# Patient Record
Sex: Female | Born: 1950 | Race: White | Hispanic: No | Smoking: Never smoker
Health system: Southern US, Community
[De-identification: ages and names within clinical notes are randomized; demographics above are authoritative.]

## PROBLEM LIST (undated history)

## (undated) DIAGNOSIS — Z87442 Personal history of urinary calculi: Secondary | ICD-10-CM

## (undated) DIAGNOSIS — I2510 Atherosclerotic heart disease of native coronary artery without angina pectoris: Secondary | ICD-10-CM

## (undated) DIAGNOSIS — G4730 Sleep apnea, unspecified: Secondary | ICD-10-CM

## (undated) HISTORY — PX: TUBAL LIGATION: SHX77

## (undated) HISTORY — PX: TONSILLECTOMY AND ADENOIDECTOMY: SHX28

## (undated) HISTORY — PX: EYE SURGERY: SHX253

---

## 2002-01-26 HISTORY — PX: JOINT REPLACEMENT: SHX530

## 2002-01-29 ENCOUNTER — Encounter: Payer: Self-pay | Admitting: Orthopedic Surgery

## 2002-02-04 ENCOUNTER — Encounter: Payer: Self-pay | Admitting: Orthopedic Surgery

## 2002-02-04 ENCOUNTER — Inpatient Hospital Stay (HOSPITAL_COMMUNITY): Admission: RE | Admit: 2002-02-04 | Discharge: 2002-02-08 | Payer: Self-pay | Admitting: Orthopedic Surgery

## 2002-02-25 ENCOUNTER — Encounter: Admission: RE | Admit: 2002-02-25 | Discharge: 2002-05-10 | Payer: Self-pay | Admitting: Orthopedic Surgery

## 2002-04-25 ENCOUNTER — Ambulatory Visit (HOSPITAL_BASED_OUTPATIENT_CLINIC_OR_DEPARTMENT_OTHER): Admission: RE | Admit: 2002-04-25 | Discharge: 2002-04-25 | Payer: Self-pay | Admitting: Orthopedic Surgery

## 2004-02-27 ENCOUNTER — Ambulatory Visit (HOSPITAL_COMMUNITY): Admission: RE | Admit: 2004-02-27 | Discharge: 2004-02-27 | Payer: Self-pay | Admitting: Internal Medicine

## 2004-02-27 ENCOUNTER — Ambulatory Visit: Payer: Self-pay | Admitting: Internal Medicine

## 2012-02-08 DIAGNOSIS — R079 Chest pain, unspecified: Secondary | ICD-10-CM

## 2012-02-09 ENCOUNTER — Encounter: Payer: Self-pay | Admitting: Cardiology

## 2012-02-09 DIAGNOSIS — R079 Chest pain, unspecified: Secondary | ICD-10-CM

## 2016-06-10 ENCOUNTER — Encounter: Payer: Self-pay | Admitting: Physician Assistant

## 2016-06-10 ENCOUNTER — Ambulatory Visit (INDEPENDENT_AMBULATORY_CARE_PROVIDER_SITE_OTHER): Payer: BLUE CROSS/BLUE SHIELD | Admitting: Physician Assistant

## 2016-06-10 VITALS — BP 146/81 | HR 58 | Temp 96.8°F | Ht 61.0 in | Wt 191.0 lb

## 2016-06-10 DIAGNOSIS — F509 Eating disorder, unspecified: Secondary | ICD-10-CM

## 2016-06-10 DIAGNOSIS — Z0000 Encounter for general adult medical examination without abnormal findings: Secondary | ICD-10-CM | POA: Diagnosis not present

## 2016-06-10 DIAGNOSIS — R030 Elevated blood-pressure reading, without diagnosis of hypertension: Secondary | ICD-10-CM | POA: Insufficient documentation

## 2016-06-10 MED ORDER — BUPROPION HCL ER (SR) 150 MG PO TB12: 150.0000 mg | ORAL_TABLET | Freq: Two times a day (BID) | ORAL | 5 refills | Status: DC

## 2016-06-10 NOTE — Patient Instructions (Signed)
DASH Eating Plan DASH stands for "Dietary Approaches to Stop Hypertension." The DASH eating plan is a healthy eating plan that has been shown to reduce high blood pressure (hypertension). It may also reduce your risk for type 2 diabetes, heart disease, and stroke. The DASH eating plan may also help with weight loss. What are tips for following this plan? General guidelines  Avoid eating more than 2,300 mg (milligrams) of salt (sodium) a day. If you have hypertension, you may need to reduce your sodium intake to 1,500 mg a day.  Limit alcohol intake to no more than 1 drink a day for nonpregnant women and 2 drinks a day for men. One drink equals 12 oz of beer, 5 oz of wine, or 1 oz of hard liquor.  Work with your health care provider to maintain a healthy body weight or to lose weight. Ask what an ideal weight is for you.  Get at least 30 minutes of exercise that causes your heart to beat faster (aerobic exercise) most days of the week. Activities may include walking, swimming, or biking.  Work with your health care provider or diet and nutrition specialist (dietitian) to adjust your eating plan to your individual calorie needs. Reading food labels  Check food labels for the amount of sodium per serving. Choose foods with less than 5 percent of the Daily Value of sodium. Generally, foods with less than 300 mg of sodium per serving fit into this eating plan.  To find whole grains, look for the word "whole" as the first word in the ingredient list. Shopping  Buy products labeled as "low-sodium" or "no salt added."  Buy fresh foods. Avoid canned foods and premade or frozen meals. Cooking  Avoid adding salt when cooking. Use salt-free seasonings or herbs instead of table salt or sea salt. Check with your health care provider or pharmacist before using salt substitutes.  Do not fry foods. Cook foods using healthy methods such as baking, boiling, grilling, and broiling instead.  Cook with  heart-healthy oils, such as olive, canola, soybean, or sunflower oil. Meal planning   Eat a balanced diet that includes: ? 5 or more servings of fruits and vegetables each day. At each meal, try to fill half of your plate with fruits and vegetables. ? Up to 6-8 servings of whole grains each day. ? Less than 6 oz of lean meat, poultry, or fish each day. A 3-oz serving of meat is about the same size as a deck of cards. One egg equals 1 oz. ? 2 servings of low-fat dairy each day. ? A serving of nuts, seeds, or beans 5 times each week. ? Heart-healthy fats. Healthy fats called Omega-3 fatty acids are found in foods such as flaxseeds and coldwater fish, like sardines, salmon, and mackerel.  Limit how much you eat of the following: ? Canned or prepackaged foods. ? Food that is high in trans fat, such as fried foods. ? Food that is high in saturated fat, such as fatty meat. ? Sweets, desserts, sugary drinks, and other foods with added sugar. ? Full-fat dairy products.  Do not salt foods before eating.  Try to eat at least 2 vegetarian meals each week.  Eat more home-cooked food and less restaurant, buffet, and fast food.  When eating at a restaurant, ask that your food be prepared with less salt or no salt, if possible. What foods are recommended? The items listed may not be a complete list. Talk with your dietitian about what   dietary choices are best for you. Grains Whole-grain or whole-wheat bread. Whole-grain or whole-wheat pasta. Brown rice. Oatmeal. Quinoa. Bulgur. Whole-grain and low-sodium cereals. Pita bread. Low-fat, low-sodium crackers. Whole-wheat flour tortillas. Vegetables Fresh or frozen vegetables (raw, steamed, roasted, or grilled). Low-sodium or reduced-sodium tomato and vegetable juice. Low-sodium or reduced-sodium tomato sauce and tomato paste. Low-sodium or reduced-sodium canned vegetables. Fruits All fresh, dried, or frozen fruit. Canned fruit in natural juice (without  added sugar). Meat and other protein foods Skinless chicken or turkey. Ground chicken or turkey. Pork with fat trimmed off. Fish and seafood. Egg whites. Dried beans, peas, or lentils. Unsalted nuts, nut butters, and seeds. Unsalted canned beans. Lean cuts of beef with fat trimmed off. Low-sodium, lean deli meat. Dairy Low-fat (1%) or fat-free (skim) milk. Fat-free, low-fat, or reduced-fat cheeses. Nonfat, low-sodium ricotta or cottage cheese. Low-fat or nonfat yogurt. Low-fat, low-sodium cheese. Fats and oils Soft margarine without trans fats. Vegetable oil. Low-fat, reduced-fat, or light mayonnaise and salad dressings (reduced-sodium). Canola, safflower, olive, soybean, and sunflower oils. Avocado. Seasoning and other foods Herbs. Spices. Seasoning mixes without salt. Unsalted popcorn and pretzels. Fat-free sweets. What foods are not recommended? The items listed may not be a complete list. Talk with your dietitian about what dietary choices are best for you. Grains Baked goods made with fat, such as croissants, muffins, or some breads. Dry pasta or rice meal packs. Vegetables Creamed or fried vegetables. Vegetables in a cheese sauce. Regular canned vegetables (not low-sodium or reduced-sodium). Regular canned tomato sauce and paste (not low-sodium or reduced-sodium). Regular tomato and vegetable juice (not low-sodium or reduced-sodium). Pickles. Olives. Fruits Canned fruit in a light or heavy syrup. Fried fruit. Fruit in cream or butter sauce. Meat and other protein foods Fatty cuts of meat. Ribs. Fried meat. Bacon. Sausage. Bologna and other processed lunch meats. Salami. Fatback. Hotdogs. Bratwurst. Salted nuts and seeds. Canned beans with added salt. Canned or smoked fish. Whole eggs or egg yolks. Chicken or turkey with skin. Dairy Whole or 2% milk, cream, and half-and-half. Whole or full-fat cream cheese. Whole-fat or sweetened yogurt. Full-fat cheese. Nondairy creamers. Whipped toppings.  Processed cheese and cheese spreads. Fats and oils Butter. Stick margarine. Lard. Shortening. Ghee. Bacon fat. Tropical oils, such as coconut, palm kernel, or palm oil. Seasoning and other foods Salted popcorn and pretzels. Onion salt, garlic salt, seasoned salt, table salt, and sea salt. Worcestershire sauce. Tartar sauce. Barbecue sauce. Teriyaki sauce. Soy sauce, including reduced-sodium. Steak sauce. Canned and packaged gravies. Fish sauce. Oyster sauce. Cocktail sauce. Horseradish that you find on the shelf. Ketchup. Mustard. Meat flavorings and tenderizers. Bouillon cubes. Hot sauce and Tabasco sauce. Premade or packaged marinades. Premade or packaged taco seasonings. Relishes. Regular salad dressings. Where to find more information:  National Heart, Lung, and Blood Institute: www.nhlbi.nih.gov  American Heart Association: www.heart.org Summary  The DASH eating plan is a healthy eating plan that has been shown to reduce high blood pressure (hypertension). It may also reduce your risk for type 2 diabetes, heart disease, and stroke.  With the DASH eating plan, you should limit salt (sodium) intake to 2,300 mg a day. If you have hypertension, you may need to reduce your sodium intake to 1,500 mg a day.  When on the DASH eating plan, aim to eat more fresh fruits and vegetables, whole grains, lean proteins, low-fat dairy, and heart-healthy fats.  Work with your health care provider or diet and nutrition specialist (dietitian) to adjust your eating plan to your individual   calorie needs. This information is not intended to replace advice given to you by your health care provider. Make sure you discuss any questions you have with your health care provider. Document Released: 03/03/2011 Document Revised: 03/07/2016 Document Reviewed: 03/07/2016 Elsevier Interactive Patient Education  2017 Elsevier Inc.  

## 2016-06-10 NOTE — Progress Notes (Signed)
BP (!) 146/81   Pulse (!) 58   Temp (!) 96.8 F (36 C) (Oral)   Ht '5\' 1"'$  (1.549 m)   Wt 191 lb (86.6 kg)   BMI 36.09 kg/m    Subjective:    Patient ID: Alyssa Burns, female    DOB: 1950-06-24, 66 y.o.   MRN: [id number]  Alyssa Burns is a 66 y.o. female presenting on 06/10/2016 for Establish Care  HPI This patient comes in for annual well physical examination. All medications are reviewed today. There are no reports of any problems with the medications. All of the medical conditions are reviewed and updated.  Lab work is reviewed and will be ordered as medically necessary. There are no new problems reported with today's visit.  Patient reports doing well overall.  She does report increased appetite and overeating later in the day. She will have emotional and stressful eating. We have discussed how Wellbutrin is used for this type of emotional eating and she is willing to try the medication.  History reviewed. No pertinent past medical history. Relevant past medical, surgical, family and social history reviewed and updated as indicated. Interim medical history since our last visit reviewed. Allergies and medications reviewed and updated.   Data reviewed from any sources in EPIC.  Review of Systems  Constitutional: Negative.  Negative for activity change, fatigue and fever.  HENT: Negative.   Eyes: Negative.   Respiratory: Negative.  Negative for cough.   Cardiovascular: Negative.  Negative for chest pain.  Gastrointestinal: Negative.  Negative for abdominal pain.  Endocrine: Negative.   Genitourinary: Negative.  Negative for dysuria.  Musculoskeletal: Negative.   Skin: Negative.   Neurological: Negative.   Psychiatric/Behavioral: Positive for dysphoric mood.     Social History   Social History  . Marital status: Married    Spouse name: N/A  . Number of children: N/A  . Years of education: N/A   Occupational History  . Not on file.   Social History Main  Topics  . Smoking status: Never Smoker  . Smokeless tobacco: Never Used  . Alcohol use No  . Drug use: No  . Sexual activity: Not on file   Other Topics Concern  . Not on file   Social History Narrative  . No narrative on file    Past Surgical History:  Procedure Laterality Date  . JOINT REPLACEMENT Left 01/2002   knee  . TONSILLECTOMY AND ADENOIDECTOMY    . TUBAL LIGATION      Family History  Problem Relation Age of Onset  . Arthritis Mother   . Hearing loss Mother   . Arthritis Father   . Cancer Father     prostate  . Diabetes Father   . Heart disease Father   . Heart disease Brother     Allergies as of 06/10/2016      Reactions   Percocet [oxycodone-acetaminophen] Itching      Medication List       Accurate as of 06/10/16  4:48 PM. Always use your most recent med list.          buPROPion 150 MG 12 hr tablet Commonly known as:  WELLBUTRIN SR Take 1 tablet (150 mg total) by mouth 2 (two) times daily.          Objective:    BP (!) 146/81   Pulse (!) 58   Temp (!) 96.8 F (36 C) (Oral)   Ht '5\' 1"'$  (1.549 m)   Wt  191 lb (86.6 kg)   BMI 36.09 kg/m   Allergies  Allergen Reactions  . Percocet [Oxycodone-Acetaminophen] Itching   Wt Readings from Last 3 Encounters:  06/10/16 191 lb (86.6 kg)    Physical Exam  Constitutional: She is oriented to person, place, and time. She appears well-developed and well-nourished.  HENT:  Head: Normocephalic and atraumatic.  Right Ear: Tympanic membrane, external ear and ear canal normal.  Left Ear: Tympanic membrane, external ear and ear canal normal.  Nose: Nose normal. No rhinorrhea.  Mouth/Throat: Oropharynx is clear and moist and mucous membranes are normal. No oropharyngeal exudate or posterior oropharyngeal erythema.  Eyes: Conjunctivae and EOM are normal. Pupils are equal, round, and reactive to light.  Neck: Normal range of motion. Neck supple.  Cardiovascular: Normal rate, regular rhythm, normal heart  sounds and intact distal pulses.   Pulmonary/Chest: Effort normal and breath sounds normal.  Abdominal: Soft. Bowel sounds are normal.  Neurological: She is alert and oriented to person, place, and time. She has normal reflexes.  Skin: Skin is warm and dry. No rash noted.  Psychiatric: She has a normal mood and affect. Her behavior is normal. Judgment and thought content normal.  Nursing note and vitals reviewed.   No results found for this or any previous visit.    Assessment & Plan:   1. Well adult exam - CBC with Differential/Platelet - CMP14+EGFR - Lipid panel - TSH  2. Elevated BP without diagnosis of hypertension Monitor at her office and call id now improved  3. Disorder of eating - buPROPion (WELLBUTRIN SR) 150 MG 12 hr tablet; Take 1 tablet (150 mg total) by mouth 2 (two) times daily.  Dispense: 30 tablet; Refill: 5   Continue all other maintenance medications as listed above. Educational handout given for DASH diet  Follow up plan: Return in about 3 months (around 09/10/2016) for recheck.  Terald Sleeper PA-C [facility] [address]  [place],  [address] [phone]   06/10/2016, 4:48 PM

## 2016-06-11 LAB — CMP14+EGFR
ALT: 14 IU/L (ref 0–32)
AST: 16 IU/L (ref 0–40)
Albumin/Globulin Ratio: 1.7 (ref 1.2–2.2)
Albumin: 4.3 g/dL (ref 3.6–4.8)
Alkaline Phosphatase: 77 IU/L (ref 39–117)
BUN/Creatinine Ratio: 12 (ref 12–28)
BUN: 12 mg/dL (ref 8–27)
Bilirubin Total: 0.3 mg/dL (ref 0.0–1.2)
CO2: 27 mmol/L (ref 18–29)
Calcium: 10.1 mg/dL (ref 8.7–10.3)
Chloride: 101 mmol/L (ref 96–106)
Creatinine, Ser: 0.97 mg/dL (ref 0.57–1.00)
GFR calc Af Amer: 71 mL/min/{1.73_m2} (ref >59)
GFR calc non Af Amer: 61 mL/min/{1.73_m2} (ref >59)
Globulin, Total: 2.5 g/dL (ref 1.5–4.5)
Glucose: 96 mg/dL (ref 65–99)
Potassium: 4.2 mmol/L (ref 3.5–5.2)
Sodium: 144 mmol/L (ref 134–144)
Total Protein: 6.8 g/dL (ref 6.0–8.5)

## 2016-06-11 LAB — CBC WITH DIFFERENTIAL/PLATELET
Basophils Absolute: 0 10*3/uL (ref 0.0–0.2)
Basos: 0 %
EOS (ABSOLUTE): 0.1 10*3/uL (ref 0.0–0.4)
Eos: 2 %
Hematocrit: 42.5 % (ref 34.0–46.6)
Hemoglobin: 14.4 g/dL (ref 11.1–15.9)
Immature Grans (Abs): 0 10*3/uL (ref 0.0–0.1)
Immature Granulocytes: 0 %
Lymphocytes Absolute: 2 10*3/uL (ref 0.7–3.1)
Lymphs: 29 %
MCH: 28.1 pg (ref 26.6–33.0)
MCHC: 33.9 g/dL (ref 31.5–35.7)
MCV: 83 fL (ref 79–97)
Monocytes Absolute: 0.4 10*3/uL (ref 0.1–0.9)
Monocytes: 5 %
Neutrophils Absolute: 4.3 10*3/uL (ref 1.4–7.0)
Neutrophils: 64 %
Platelets: 215 10*3/uL (ref 150–379)
RBC: 5.13 x10E6/uL (ref 3.77–5.28)
RDW: 14.5 % (ref 12.3–15.4)
WBC: 6.7 10*3/uL (ref 3.4–10.8)

## 2016-06-11 LAB — TSH: TSH: 2.38 u[IU]/mL (ref 0.450–4.500)

## 2016-06-11 LAB — LIPID PANEL
Chol/HDL Ratio: 4.1 ratio units (ref 0.0–4.4)
Cholesterol, Total: 197 mg/dL (ref 100–199)
HDL: 48 mg/dL (ref >39)
LDL Calculated: 109 mg/dL — ABNORMAL HIGH (ref 0–99)
Triglycerides: 200 mg/dL — ABNORMAL HIGH (ref 0–149)
VLDL Cholesterol Cal: 40 mg/dL (ref 5–40)

## 2016-08-12 ENCOUNTER — Other Ambulatory Visit: Payer: Self-pay | Admitting: Physician Assistant

## 2016-08-12 DIAGNOSIS — Z780 Asymptomatic menopausal state: Secondary | ICD-10-CM

## 2016-08-19 ENCOUNTER — Ambulatory Visit (INDEPENDENT_AMBULATORY_CARE_PROVIDER_SITE_OTHER): Payer: BLUE CROSS/BLUE SHIELD

## 2016-08-19 DIAGNOSIS — Z780 Asymptomatic menopausal state: Secondary | ICD-10-CM

## 2018-05-11 ENCOUNTER — Encounter: Payer: Self-pay | Admitting: Physician Assistant

## 2018-05-11 ENCOUNTER — Ambulatory Visit (INDEPENDENT_AMBULATORY_CARE_PROVIDER_SITE_OTHER): Payer: Medicare Other | Admitting: Physician Assistant

## 2018-05-11 VITALS — BP 143/84 | HR 64 | Temp 96.9°F | Ht 61.0 in | Wt 176.6 lb

## 2018-05-11 DIAGNOSIS — Z0000 Encounter for general adult medical examination without abnormal findings: Secondary | ICD-10-CM

## 2018-05-11 NOTE — Patient Instructions (Signed)
Health Maintenance After Age 67 After age 67, you are at a higher risk for certain long-term diseases and infections as well as injuries from falls. Falls are a major cause of broken bones and head injuries in people who are older than age 67. Getting regular preventive care can help to keep you healthy and well. Preventive care includes getting regular testing and making lifestyle changes as recommended by your health care provider. Talk with your health care provider about:  Which screenings and tests you should have. A screening is a test that checks for a disease when you have no symptoms.  A diet and exercise plan that is right for you. What should I know about screenings and tests to prevent falls? Screening and testing are the best ways to find a health problem early. Early diagnosis and treatment give you the best chance of managing medical conditions that are common after age 67. Certain conditions and lifestyle choices may make you more likely to have a fall. Your health care provider may recommend:  Regular vision checks. Poor vision and conditions such as cataracts can make you more likely to have a fall. If you wear glasses, make sure to get your prescription updated if your vision changes.  Medicine review. Work with your health care provider to regularly review all of the medicines you are taking, including over-the-counter medicines. Ask your health care provider about any side effects that may make you more likely to have a fall. Tell your health care provider if any medicines that you take make you feel dizzy or sleepy.  Osteoporosis screening. Osteoporosis is a condition that causes the bones to get weaker. This can make the bones weak and cause them to break more easily.  Blood pressure screening. Blood pressure changes and medicines to control blood pressure can make you feel dizzy.  Strength and balance checks. Your health care provider may recommend certain tests to check your  strength and balance while standing, walking, or changing positions.  Foot health exam. Foot pain and numbness, as well as not wearing proper footwear, can make you more likely to have a fall.  Depression screening. You may be more likely to have a fall if you have a fear of falling, feel emotionally low, or feel unable to do activities that you used to do.  Alcohol use screening. Using too much alcohol can affect your balance and may make you more likely to have a fall. What actions can I take to lower my risk of falls? General instructions  Talk with your health care provider about your risks for falling. Tell your health care provider if: ? You fall. Be sure to tell your health care provider about all falls, even ones that seem minor. ? You feel dizzy, sleepy, or off-balance.  Take over-the-counter and prescription medicines only as told by your health care provider. These include any supplements.  Eat a healthy diet and maintain a healthy weight. A healthy diet includes low-fat dairy products, low-fat (lean) meats, and fiber from whole grains, beans, and lots of fruits and vegetables. Home safety  Remove any tripping hazards, such as rugs, cords, and clutter.  Install safety equipment such as grab bars in bathrooms and safety rails on stairs.  Keep rooms and walkways well-lit. Activity   Follow a regular exercise program to stay fit. This will help you maintain your balance. Ask your health care provider what types of exercise are appropriate for you.  If you need a cane or   walker, use it as recommended by your health care provider.  Wear supportive shoes that have nonskid soles. Lifestyle  Do not drink alcohol if your health care provider tells you not to drink.  If you drink alcohol, limit how much you have: ? 0-1 drink a day for women. ? 0-2 drinks a day for men.  Be aware of how much alcohol is in your drink. In the [place] one drink equals one typical bottle of beer (12  oz), one-half glass of wine (5 oz), or one shot of hard liquor (1 oz).  Do not use any products that contain nicotine or tobacco, such as cigarettes and e-cigarettes. If you need help quitting, ask your health care provider. Summary  Having a healthy lifestyle and getting preventive care can help to protect your health and wellness after age 67.  Screening and testing are the best way to find a health problem early and help you avoid having a fall. Early diagnosis and treatment give you the best chance for managing medical conditions that are more common for people who are older than age 67.  Falls are a major cause of broken bones and head injuries in people who are older than age 67. Take precautions to prevent a fall at home.  Work with your health care provider to learn what changes you can make to improve your health and wellness and to prevent falls. This information is not intended to replace advice given to you by your health care provider. Make sure you discuss any questions you have with your health care provider. Document Released: 01/25/2017 Document Revised: 01/25/2017 Document Reviewed: 01/25/2017 Elsevier Interactive Patient Education  2019 Elsevier Inc.  

## 2018-05-11 NOTE — Progress Notes (Signed)
BP (!) 143/84   Pulse 64   Temp (!) 96.9 F (36.1 C) (Oral)   Ht '5\' 1"'$  (1.549 m)   Wt 176 lb 9.6 oz (80.1 kg)   BMI 33.37 kg/m    Subjective:    Patient ID: Alyssa Burns, female    DOB: 1950-08-24, 68 y.o.   MRN: [id number]  HPI: Alyssa Burns is a 68 y.o. female presenting on 05/11/2018 for Annual Exam  This patient comes in for annual well physical examination. All medications are reviewed today. There are no reports of any problems with the medications. All of the medical conditions are reviewed and updated.  Lab work is reviewed and will be ordered as medically necessary. There are no new problems reported with today's visit.  Patient reports doing well overall.  She has lost about 20 pounds doing keto and daily exercise.  She is feeling quite good.  History reviewed. No pertinent past medical history. Relevant past medical, surgical, family and social history reviewed and updated as indicated. Interim medical history since our last visit reviewed. Allergies and medications reviewed and updated. DATA REVIEWED: CHART IN EPIC  Family History reviewed for pertinent findings.  Review of Systems  Constitutional: Negative.  Negative for activity change, fatigue and fever.  HENT: Negative.   Eyes: Negative.   Respiratory: Negative.  Negative for cough.   Cardiovascular: Negative.  Negative for chest pain.  Gastrointestinal: Negative.  Negative for abdominal pain.  Endocrine: Negative.   Genitourinary: Negative.  Negative for dysuria.  Musculoskeletal: Positive for arthralgias.  Skin: Negative.   Neurological: Negative.     Allergies as of 05/11/2018      Reactions   Percocet [oxycodone-acetaminophen] Itching      Medication List    as of May 11, 2018 12:48 PM   You have not been prescribed any medications.        Objective:    BP (!) 143/84   Pulse 64   Temp (!) 96.9 F (36.1 C) (Oral)   Ht '5\' 1"'$  (1.549 m)   Wt 176 lb 9.6 oz (80.1 kg)   BMI  33.37 kg/m   Allergies  Allergen Reactions  . Percocet [Oxycodone-Acetaminophen] Itching    Wt Readings from Last 3 Encounters:  05/11/18 176 lb 9.6 oz (80.1 kg)  06/10/16 191 lb (86.6 kg)    Physical Exam Constitutional:      Appearance: She is well-developed.  HENT:     Head: Normocephalic and atraumatic.     Right Ear: Tympanic membrane, ear canal and external ear normal.     Left Ear: Tympanic membrane, ear canal and external ear normal.     Nose: Nose normal. No rhinorrhea.     Mouth/Throat:     Pharynx: No oropharyngeal exudate or posterior oropharyngeal erythema.  Eyes:     Conjunctiva/sclera: Conjunctivae normal.     Pupils: Pupils are equal, round, and reactive to light.  Neck:     Musculoskeletal: Normal range of motion and neck supple.  Cardiovascular:     Rate and Rhythm: Normal rate and regular rhythm.     Heart sounds: Normal heart sounds.  Pulmonary:     Effort: Pulmonary effort is normal.     Breath sounds: Normal breath sounds.  Abdominal:     General: Bowel sounds are normal.     Palpations: Abdomen is soft.  Skin:    General: Skin is warm and dry.     Findings: No rash.  Neurological:  Mental Status: She is alert and oriented to person, place, and time.     Deep Tendon Reflexes: Reflexes are normal and symmetric.  Psychiatric:        Behavior: Behavior normal.        Thought Content: Thought content normal.        Judgment: Judgment normal.         Assessment & Plan:   1. Well adult exam - NMR, lipoprofile - CBC with Differential/Platelet - CMP14+EGFR - TSH   Continue all other maintenance medications as listed above.  Follow up plan: Return in about 1 year (around 05/12/2019).  Educational handout given for health maintenance  Terald Sleeper PA-C [facility] [address]  [place], [place] [address] [phone]   05/11/2018, 12:48 PM

## 2018-05-12 LAB — CMP14+EGFR
A/G RATIO: 1.9 (ref 1.2–2.2)
ALT: 20 IU/L (ref 0–32)
AST: 17 IU/L (ref 0–40)
Albumin: 4.2 g/dL (ref 3.8–4.8)
Alkaline Phosphatase: 68 IU/L (ref 39–117)
BUN/Creatinine Ratio: 20 (ref 12–28)
BUN: 15 mg/dL (ref 8–27)
Bilirubin Total: 0.5 mg/dL (ref 0.0–1.2)
CALCIUM: 9.4 mg/dL (ref 8.7–10.3)
CO2: 20 mmol/L (ref 20–29)
CREATININE: 0.74 mg/dL (ref 0.57–1.00)
Chloride: 103 mmol/L (ref 96–106)
GFR, EST AFRICAN AMERICAN: 97 mL/min/{1.73_m2} (ref >59)
GFR, EST NON AFRICAN AMERICAN: 84 mL/min/{1.73_m2} (ref >59)
GLOBULIN, TOTAL: 2.2 g/dL (ref 1.5–4.5)
Glucose: 92 mg/dL (ref 65–99)
Potassium: 3.9 mmol/L (ref 3.5–5.2)
SODIUM: 142 mmol/L (ref 134–144)
TOTAL PROTEIN: 6.4 g/dL (ref 6.0–8.5)

## 2018-05-12 LAB — NMR, LIPOPROFILE
Cholesterol, Total: 208 mg/dL — ABNORMAL HIGH (ref 100–199)
HDL Particle Number: 32.6 umol/L (ref >=30.5)
HDL-C: 49 mg/dL (ref >39)
LDL Particle Number: 1377 nmol/L — ABNORMAL HIGH (ref <1000)
LDL Size: 20.9 nm (ref >20.5)
LDL-C: 137 mg/dL — AB (ref 0–99)
LP-IR Score: 45 (ref <=45)
Small LDL Particle Number: 292 nmol/L (ref <=527)
TRIGLYCERIDES: 111 mg/dL (ref 0–149)

## 2018-05-12 LAB — CBC WITH DIFFERENTIAL/PLATELET
BASOS: 0 %
Basophils Absolute: 0 10*3/uL (ref 0.0–0.2)
EOS (ABSOLUTE): 0.1 10*3/uL (ref 0.0–0.4)
EOS: 1 %
HEMATOCRIT: 41.5 % (ref 34.0–46.6)
HEMOGLOBIN: 13.9 g/dL (ref 11.1–15.9)
IMMATURE GRANS (ABS): 0 10*3/uL (ref 0.0–0.1)
IMMATURE GRANULOCYTES: 0 %
LYMPHS: 29 %
Lymphocytes Absolute: 1.6 10*3/uL (ref 0.7–3.1)
MCH: 28 pg (ref 26.6–33.0)
MCHC: 33.5 g/dL (ref 31.5–35.7)
MCV: 84 fL (ref 79–97)
MONOCYTES: 6 %
Monocytes Absolute: 0.3 10*3/uL (ref 0.1–0.9)
NEUTROS PCT: 64 %
Neutrophils Absolute: 3.4 10*3/uL (ref 1.4–7.0)
Platelets: 201 10*3/uL (ref 150–450)
RBC: 4.97 x10E6/uL (ref 3.77–5.28)
RDW: 14.3 % (ref 11.7–15.4)
WBC: 5.3 10*3/uL (ref 3.4–10.8)

## 2018-05-12 LAB — TSH: TSH: 2.45 u[IU]/mL (ref 0.450–4.500)

## 2018-08-02 ENCOUNTER — Other Ambulatory Visit: Payer: Self-pay

## 2018-08-02 ENCOUNTER — Encounter (HOSPITAL_BASED_OUTPATIENT_CLINIC_OR_DEPARTMENT_OTHER): Payer: Self-pay | Admitting: *Deleted

## 2018-08-03 ENCOUNTER — Encounter (HOSPITAL_BASED_OUTPATIENT_CLINIC_OR_DEPARTMENT_OTHER)
Admission: RE | Disposition: A | Discharge status: Home / Self Care | Payer: Self-pay | Source: Home / Self Care | Attending: Orthopaedic Surgery

## 2018-08-03 ENCOUNTER — Encounter (HOSPITAL_BASED_OUTPATIENT_CLINIC_OR_DEPARTMENT_OTHER): Payer: Self-pay | Admitting: *Deleted

## 2018-08-03 ENCOUNTER — Ambulatory Visit (HOSPITAL_BASED_OUTPATIENT_CLINIC_OR_DEPARTMENT_OTHER): Payer: Medicare Other | Admitting: Anesthesiology

## 2018-08-03 ENCOUNTER — Ambulatory Visit (HOSPITAL_BASED_OUTPATIENT_CLINIC_OR_DEPARTMENT_OTHER)
Admission: RE | Admit: 2018-08-03 | Discharge: 2018-08-03 | Disposition: A | Discharge status: Home / Self Care | Payer: Medicare Other | Attending: Orthopaedic Surgery | Admitting: Orthopaedic Surgery

## 2018-08-03 DIAGNOSIS — Z885 Allergy status to narcotic agent status: Secondary | ICD-10-CM | POA: Diagnosis not present

## 2018-08-03 DIAGNOSIS — S82841A Displaced bimalleolar fracture of right lower leg, initial encounter for closed fracture: Secondary | ICD-10-CM | POA: Insufficient documentation

## 2018-08-03 DIAGNOSIS — G4730 Sleep apnea, unspecified: Secondary | ICD-10-CM | POA: Diagnosis not present

## 2018-08-03 DIAGNOSIS — X58XXXA Exposure to other specified factors, initial encounter: Secondary | ICD-10-CM | POA: Insufficient documentation

## 2018-08-03 DIAGNOSIS — Z96652 Presence of left artificial knee joint: Secondary | ICD-10-CM | POA: Insufficient documentation

## 2018-08-03 HISTORY — DX: Sleep apnea, unspecified: G47.30

## 2018-08-03 HISTORY — PX: ORIF ANKLE FRACTURE: SHX5408

## 2018-08-03 SURGERY — OPEN REDUCTION INTERNAL FIXATION (ORIF) ANKLE FRACTURE
Anesthesia: General | Site: Ankle | Laterality: Right

## 2018-08-03 MED ORDER — PROPOFOL 10 MG/ML IV BOLUS
INTRAVENOUS | Status: AC
  Filled 2018-08-03: qty 40

## 2018-08-03 MED ORDER — ONDANSETRON HCL 4 MG/2ML IJ SOLN
INTRAMUSCULAR | Status: DC | PRN
  Administered 2018-08-03: 4 mg via INTRAVENOUS

## 2018-08-03 MED ORDER — CEFAZOLIN SODIUM-DEXTROSE 2-4 GM/100ML-% IV SOLN
INTRAVENOUS | Status: AC
  Filled 2018-08-03: qty 100

## 2018-08-03 MED ORDER — CELECOXIB 100 MG PO CAPS: 100.0000 mg | ORAL_CAPSULE | Freq: Two times a day (BID) | ORAL | 2 refills | Status: DC

## 2018-08-03 MED ORDER — ONDANSETRON HCL 4 MG/2ML IJ SOLN: 4.0000 mg | Freq: Once | INTRAMUSCULAR | Status: DC | PRN

## 2018-08-03 MED ORDER — MIDAZOLAM HCL 2 MG/2ML IJ SOLN
1.0000 mg | INTRAMUSCULAR | Status: DC | PRN
  Administered 2018-08-03: 1 mg via INTRAVENOUS

## 2018-08-03 MED ORDER — CEFAZOLIN SODIUM-DEXTROSE 2-4 GM/100ML-% IV SOLN
2.0000 g | INTRAVENOUS | Status: AC
  Administered 2018-08-03: 2 g via INTRAVENOUS

## 2018-08-03 MED ORDER — DEXAMETHASONE SODIUM PHOSPHATE 10 MG/ML IJ SOLN
INTRAMUSCULAR | Status: DC | PRN
  Administered 2018-08-03: 10 mg via INTRAVENOUS

## 2018-08-03 MED ORDER — ASPIRIN 81 MG PO TABS: 81.0000 mg | ORAL_TABLET | Freq: Two times a day (BID) | ORAL | 0 refills | Status: DC

## 2018-08-03 MED ORDER — BUPIVACAINE-EPINEPHRINE (PF) 0.5% -1:200000 IJ SOLN
INTRAMUSCULAR | Status: DC | PRN
  Administered 2018-08-03: 15 mL via PERINEURAL
  Administered 2018-08-03: 25 mL via PERINEURAL

## 2018-08-03 MED ORDER — ONDANSETRON HCL 4 MG/2ML IJ SOLN
INTRAMUSCULAR | Status: AC
  Filled 2018-08-03: qty 2

## 2018-08-03 MED ORDER — MIDAZOLAM HCL 2 MG/2ML IJ SOLN
INTRAMUSCULAR | Status: AC
  Filled 2018-08-03: qty 2

## 2018-08-03 MED ORDER — LACTATED RINGERS IV SOLN
INTRAVENOUS | Status: DC
  Administered 2018-08-03 (×2): via INTRAVENOUS

## 2018-08-03 MED ORDER — FENTANYL CITRATE (PF) 100 MCG/2ML IJ SOLN
INTRAMUSCULAR | Status: AC
  Filled 2018-08-03: qty 2

## 2018-08-03 MED ORDER — VANCOMYCIN HCL 1000 MG IV SOLR
INTRAVENOUS | Status: DC | PRN
  Administered 2018-08-03: 1000 mg via TOPICAL

## 2018-08-03 MED ORDER — PROPOFOL 10 MG/ML IV BOLUS
INTRAVENOUS | Status: DC | PRN
  Administered 2018-08-03: 150 mg via INTRAVENOUS

## 2018-08-03 MED ORDER — DEXAMETHASONE SODIUM PHOSPHATE 10 MG/ML IJ SOLN
INTRAMUSCULAR | Status: AC
  Filled 2018-08-03: qty 1

## 2018-08-03 MED ORDER — LIDOCAINE 2% (20 MG/ML) 5 ML SYRINGE
INTRAMUSCULAR | Status: AC
  Filled 2018-08-03: qty 5

## 2018-08-03 MED ORDER — FENTANYL CITRATE (PF) 100 MCG/2ML IJ SOLN
50.0000 ug | INTRAMUSCULAR | Status: DC | PRN
  Administered 2018-08-03: 50 ug via INTRAVENOUS

## 2018-08-03 MED ORDER — CHLORHEXIDINE GLUCONATE 4 % EX LIQD: 60.0000 mL | Freq: Once | CUTANEOUS | Status: DC

## 2018-08-03 MED ORDER — OXYCODONE HCL 5 MG PO TABS: ORAL_TABLET | ORAL | 0 refills | Status: AC

## 2018-08-03 MED ORDER — SCOPOLAMINE 1 MG/3DAYS TD PT72: 1.0000 | MEDICATED_PATCH | Freq: Once | TRANSDERMAL | Status: DC | PRN

## 2018-08-03 MED ORDER — ONDANSETRON HCL 4 MG PO TABS: 4.0000 mg | ORAL_TABLET | Freq: Three times a day (TID) | ORAL | 1 refills | Status: DC | PRN

## 2018-08-03 MED ORDER — FENTANYL CITRATE (PF) 100 MCG/2ML IJ SOLN
INTRAMUSCULAR | Status: DC | PRN
  Administered 2018-08-03: 25 ug via INTRAVENOUS
  Administered 2018-08-03: 75 ug via INTRAVENOUS

## 2018-08-03 MED ORDER — LIDOCAINE 2% (20 MG/ML) 5 ML SYRINGE
INTRAMUSCULAR | Status: DC | PRN
  Administered 2018-08-03: 60 mg via INTRAVENOUS

## 2018-08-03 MED ORDER — FENTANYL CITRATE (PF) 100 MCG/2ML IJ SOLN: 25.0000 ug | INTRAMUSCULAR | Status: DC | PRN

## 2018-08-03 MED ORDER — ACETAMINOPHEN 500 MG PO TABS: 1000.0000 mg | ORAL_TABLET | Freq: Three times a day (TID) | ORAL | 0 refills | Status: AC

## 2018-08-03 SURGICAL SUPPLY — 87 items
BANDAGE ACE 4X5 VEL STRL LF (GAUZE/BANDAGES/DRESSINGS) ×3 IMPLANT
BANDAGE ACE 6X5 VEL STRL LF (GAUZE/BANDAGES/DRESSINGS) ×3 IMPLANT
BENZOIN TINCTURE PRP APPL 2/3 (GAUZE/BANDAGES/DRESSINGS) IMPLANT
BIT DRILL 2 CANN GRADUATED (BIT) ×3 IMPLANT
BIT DRILL 2.5 CANN LNG (BIT) ×3 IMPLANT
BIT DRILL 2.5 CANN STRL (BIT) ×3 IMPLANT
BIT DRILL 2.6 CANN (BIT) ×3 IMPLANT
BIT DRILL 2.7 (BIT) ×2
BIT DRILL 2.7X2.7/3XSCR ANKL (BIT) ×1 IMPLANT
BIT DRL 2.7X2.7/3XSCR ANKL (BIT) ×1
BLADE SURG 10 STRL SS (BLADE) ×3 IMPLANT
BLADE SURG 15 STRL LF DISP TIS (BLADE) ×2 IMPLANT
BLADE SURG 15 STRL SS (BLADE) ×4
BNDG COHESIVE 4X5 TAN STRL (GAUZE/BANDAGES/DRESSINGS) ×3 IMPLANT
BNDG ESMARK 4X9 LF (GAUZE/BANDAGES/DRESSINGS) ×3 IMPLANT
CHLORAPREP W/TINT 26 (MISCELLANEOUS) ×3 IMPLANT
CLEANER CAUTERY TIP 5X5 PAD (MISCELLANEOUS) ×1 IMPLANT
CLOSURE WOUND 1/2 X4 (GAUZE/BANDAGES/DRESSINGS) ×1
COVER BACK TABLE REUSABLE LG (DRAPES) ×3 IMPLANT
COVER WAND RF STERILE (DRAPES) IMPLANT
CUFF TOURN SGL QUICK 18X4 (TOURNIQUET CUFF) ×3 IMPLANT
CUFF TOURN SGL QUICK 24 (TOURNIQUET CUFF)
CUFF TOURN SGL QUICK 34 (TOURNIQUET CUFF)
CUFF TRNQT CYL 24X4X16.5-23 (TOURNIQUET CUFF) IMPLANT
CUFF TRNQT CYL 34X4.125X (TOURNIQUET CUFF) IMPLANT
DECANTER SPIKE VIAL GLASS SM (MISCELLANEOUS) IMPLANT
DRAPE EXTREMITY T 121X128X90 (DISPOSABLE) ×3 IMPLANT
DRAPE IMP U-DRAPE 54X76 (DRAPES) ×3 IMPLANT
DRAPE OEC MINIVIEW 54X84 (DRAPES) ×3 IMPLANT
DRAPE U-SHAPE 47X51 STRL (DRAPES) ×3 IMPLANT
DRSG PAD ABDOMINAL 8X10 ST (GAUZE/BANDAGES/DRESSINGS) ×3 IMPLANT
DURAPREP 26ML APPLICATOR (WOUND CARE) ×3 IMPLANT
ELECT REM PT RETURN 9FT ADLT (ELECTROSURGICAL) ×3
ELECTRODE REM PT RTRN 9FT ADLT (ELECTROSURGICAL) ×1 IMPLANT
GAUZE SPONGE 4X4 12PLY STRL (GAUZE/BANDAGES/DRESSINGS) ×3 IMPLANT
GLOVE BIO SURGEON STRL SZ8 (GLOVE) ×3 IMPLANT
GLOVE BIOGEL PI IND STRL 8 (GLOVE) ×1 IMPLANT
GLOVE BIOGEL PI INDICATOR 8 (GLOVE) ×2
GLOVE ECLIPSE 8.0 STRL XLNG CF (GLOVE) ×3 IMPLANT
GOWN STRL REUS W/ TWL LRG LVL3 (GOWN DISPOSABLE) ×1 IMPLANT
GOWN STRL REUS W/TWL LRG LVL3 (GOWN DISPOSABLE) ×2
GOWN STRL REUS W/TWL XL LVL3 (GOWN DISPOSABLE) ×3 IMPLANT
GUIDEWIRE 1.35MM (WIRE) ×3 IMPLANT
NEEDLE HYPO 22GX1.5 SAFETY (NEEDLE) IMPLANT
NS IRRIG 1000ML POUR BTL (IV SOLUTION) ×3 IMPLANT
PACK BASIN DAY SURGERY FS (CUSTOM PROCEDURE TRAY) ×3 IMPLANT
PAD CAST 4YDX4 CTTN HI CHSV (CAST SUPPLIES) ×1 IMPLANT
PAD CLEANER CAUTERY TIP 5X5 (MISCELLANEOUS) ×2
PADDING CAST ABS 4INX4YD NS (CAST SUPPLIES) ×4
PADDING CAST ABS COTTON 4X4 ST (CAST SUPPLIES) ×2 IMPLANT
PADDING CAST COTTON 4X4 STRL (CAST SUPPLIES) ×2
PADDING CAST COTTON 6X4 STRL (CAST SUPPLIES) ×3 IMPLANT
PENCIL BUTTON HOLSTER BLD 10FT (ELECTRODE) ×3 IMPLANT
PLATE DST LCK RT H4 (Plate) ×3 IMPLANT
SCREW BN T10 FT 20X2.7XST CORT (Screw) ×1 IMPLANT
SCREW CANNULATED 4.0X40MM (Screw) ×3 IMPLANT
SCREW CORT 2.7X20 (Screw) ×2 IMPLANT
SCREW LOCK T10 FT 18X2.7X (Screw) ×1 IMPLANT
SCREW LOCK T15 FT 14X3.5XST (Screw) ×2 IMPLANT
SCREW LOCKING 2.7X14MM (Screw) ×12 IMPLANT
SCREW LOCKING 2.7X18MM (Screw) ×2 IMPLANT
SCREW LOCKING 3.5X14MM (Screw) ×4 IMPLANT
SCREW LOW PROFILE 3.5X16 (Screw) ×6 IMPLANT
SCREW TM SS 2.7X14 CORTEX (Screw) ×3 IMPLANT
SLEEVE SCD COMPRESS KNEE MED (MISCELLANEOUS) IMPLANT
SPLINT FAST PLASTER 5X30 (CAST SUPPLIES) ×40
SPLINT PLASTER CAST FAST 5X30 (CAST SUPPLIES) ×20 IMPLANT
SPONGE LAP 18X18 RF (DISPOSABLE) ×3 IMPLANT
STRIP CLOSURE SKIN 1/2X4 (GAUZE/BANDAGES/DRESSINGS) ×2 IMPLANT
SUCTION FRAZIER HANDLE 10FR (MISCELLANEOUS) ×2
SUCTION TUBE FRAZIER 10FR DISP (MISCELLANEOUS) ×1 IMPLANT
SUT MNCRL AB 4-0 PS2 18 (SUTURE) IMPLANT
SUT MON AB 3-0 SH 27 (SUTURE)
SUT MON AB 3-0 SH27 (SUTURE) IMPLANT
SUT VIC AB 0 CT1 27 (SUTURE) ×2
SUT VIC AB 0 CT1 27XBRD ANBCTR (SUTURE) ×1 IMPLANT
SUT VIC AB 3-0 SH 27 (SUTURE) ×2
SUT VIC AB 3-0 SH 27X BRD (SUTURE) ×1 IMPLANT
SYR BULB 3OZ (MISCELLANEOUS) ×3 IMPLANT
SYR CONTROL 10ML LL (SYRINGE) IMPLANT
TOWEL GREEN STERILE FF (TOWEL DISPOSABLE) ×6 IMPLANT
TUBE CONNECTING 20'X1/4 (TUBING) ×1
TUBE CONNECTING 20X1/4 (TUBING) ×2 IMPLANT
UNDERPAD 30X30 (UNDERPADS AND DIAPERS) ×3 IMPLANT
WASHER (Orthopedic Implant) ×2 IMPLANT
WASHER ORTHO 7X (Orthopedic Implant) ×1 IMPLANT
YANKAUER SUCT BULB TIP NO VENT (SUCTIONS) ×3 IMPLANT

## 2018-08-03 NOTE — Anesthesia Procedure Notes (Signed)
Anesthesia Regional Block: Adductor canal block   Pre-Anesthetic Checklist: ,, timeout performed, Correct Patient, Correct Site, Correct Laterality, Correct Procedure, Correct Position, site marked, Risks and benefits discussed,  Surgical consent,  Pre-op evaluation,  At surgeon's request and post-op pain management  Laterality: Right  Prep: chloraprep       Needles:  Injection technique: Single-shot  Needle Type: Echogenic Needle     Needle Length: 10cm  Needle Gauge: 21     Additional Needles:   Narrative:  Start time: 08/03/2018 1:12 PM End time: 08/03/2018 1:16 PM Injection made incrementally with aspirations every 5 mL.  Performed by: Personally  Anesthesiologist: Beryle Lathe, MD  Additional Notes: No pain on injection. No increased resistance to injection. Injection made in 5cc increments. Good needle visualization. Patient tolerated the procedure well.

## 2018-08-03 NOTE — Anesthesia Postprocedure Evaluation (Signed)
Anesthesia Post Note  Patient: Alyssa Burns  Procedure(s) Performed: OPEN REDUCTION INTERNAL FIXATION (ORIF) BIMALLEOLAR RIGHT ANKLE FRACTURE (Right Ankle)     Patient location during evaluation: PACU Anesthesia Type: General Level of consciousness: awake and alert Pain management: pain level controlled Vital Signs Assessment: post-procedure vital signs reviewed and stable Respiratory status: spontaneous breathing, nonlabored ventilation and respiratory function stable Cardiovascular status: blood pressure returned to baseline and stable Postop Assessment: no apparent nausea or vomiting Anesthetic complications: no    Last Vitals:  Vitals:   08/03/18 1530 08/03/18 1545  BP: 114/70 117/70  Pulse: 73 73  Resp: (!) 23 (!) 24  Temp:    SpO2: 99% 95%    Last Pain:  Vitals:   08/03/18 1545  TempSrc:   PainSc: 0-No pain                 Beryle Lathe

## 2018-08-03 NOTE — Anesthesia Procedure Notes (Signed)
Anesthesia Regional Block: Popliteal block   Pre-Anesthetic Checklist: ,, timeout performed, Correct Patient, Correct Site, Correct Laterality, Correct Procedure, Correct Position, site marked, Risks and benefits discussed,  Surgical consent,  Pre-op evaluation,  At surgeon's request and post-op pain management  Laterality: Right  Prep: chloraprep       Needles:  Injection technique: Single-shot  Needle Type: Echogenic Needle     Needle Length: 10cm  Needle Gauge: 21     Additional Needles:   Narrative:  Start time: 08/03/2018 1:17 PM End time: 08/03/2018 1:21 PM Injection made incrementally with aspirations every 5 mL.  Performed by: Personally  Anesthesiologist: Beryle Lathe, MD  Additional Notes: No pain on injection. No increased resistance to injection. Injection made in 5cc increments. Good needle visualization. Patient tolerated the procedure well.

## 2018-08-03 NOTE — Discharge Instructions (Signed)

## 2018-08-03 NOTE — H&P (Signed)
PREOPERATIVE H&P  Chief Complaint: RIGHT ANKLE FRACTURE  HPI: Alyssa Burns is a 68 y.o. female who presents for preoperative history and physical with a diagnosis of RIGHT ANKLE FRACTURE. Symptoms are rated as moderate to severe, and have been worsening.  This is significantly impairing activities of daily living.  Please see my clinic note for full details on this patient's care.  She has elected for surgical management.   Past Medical History:  Diagnosis Date  . Sleep apnea    Past Surgical History:  Procedure Laterality Date  . JOINT REPLACEMENT Left 01/2002   knee  . TONSILLECTOMY AND ADENOIDECTOMY    . TUBAL LIGATION     Social History   Socioeconomic History  . Marital status: Widowed    Spouse name: Not on file  . Number of children: Not on file  . Years of education: Not on file  . Highest education level: Not on file  Occupational History  . Not on file  Social Needs  . Financial resource strain: Not on file  . Food insecurity:    Worry: Not on file    Inability: Not on file  . Transportation needs:    Medical: Not on file    Non-medical: Not on file  Tobacco Use  . Smoking status: Never Smoker  . Smokeless tobacco: Never Used  Substance and Sexual Activity  . Alcohol use: No  . Drug use: No  . Sexual activity: Not on file  Lifestyle  . Physical activity:    Days per week: Not on file    Minutes per session: Not on file  . Stress: Not on file  Relationships  . Social connections:    Talks on phone: Not on file    Gets together: Not on file    Attends religious service: Not on file    Active member of club or organization: Not on file    Attends meetings of clubs or organizations: Not on file    Relationship status: Not on file  Other Topics Concern  . Not on file  Social History Narrative  . Not on file   Family History  Problem Relation Age of Onset  . Arthritis Mother   . Hearing loss Mother   . Arthritis Father   . Cancer Father       prostate  . Diabetes Father   . Heart disease Father   . Heart disease Brother    Allergies  Allergen Reactions  . Percocet [Oxycodone-Acetaminophen] Itching   Prior to Admission medications   Medication Sig Start Date End Date Taking? Authorizing Provider  Multiple Vitamins-Minerals (MULTIVITAL) tablet Take 1 tablet by mouth daily.   Yes Alver FisherLindner, Jodi N, RN     Positive ROS: All other systems have been reviewed and were otherwise negative with the exception of those mentioned in the HPI and as above.  Physical Exam: General: Alert, no acute distress Cardiovascular: No pedal edema Respiratory: No cyanosis, no use of accessory musculature GI: No organomegaly, abdomen is soft and non-tender Skin: No lesions in the area of chief complaint Neurologic: Sensation intact distally Psychiatric: Patient is competent for consent with normal mood and affect Lymphatic: No axillary or cervical lymphadenopathy  MUSCULOSKELETAL: R ankle: wwp foot, NVID, skin intact though abrasion proximally at shin.  Assessment: RIGHT ANKLE FRACTURE  Plan: Plan for Procedure(s): OPEN REDUCTION INTERNAL FIXATION (ORIF) BIMALLEOLAR RIGHT ANKLE FRACTURE  The risks benefits and alternatives were discussed with the patient including but not limited to  the risks of nonoperative treatment, versus surgical intervention including infection, bleeding, nerve injury,  blood clots, cardiopulmonary complications, morbidity, mortality, among others, and they were willing to proceed.   Bjorn Pippin, MD  08/03/2018 12:04 PM

## 2018-08-03 NOTE — Anesthesia Preprocedure Evaluation (Addendum)
Anesthesia Evaluation  Patient identified by MRN, date of birth, ID band Patient awake    Reviewed: Allergy & Precautions, NPO status , Patient's Chart, lab work & pertinent test results  History of Anesthesia Complications Negative for: history of anesthetic complications  Airway Mallampati: II  TM Distance: >3 FB Neck ROM: Full    Dental  (+) Dental Advisory Given, Teeth Intact   Pulmonary sleep apnea and Continuous Positive Airway Pressure Ventilation ,    breath sounds clear to auscultation       Cardiovascular negative cardio ROS   Rhythm:Regular Rate:Normal     Neuro/Psych negative neurological ROS  negative psych ROS   GI/Hepatic negative GI ROS, Neg liver ROS,   Endo/Other   Obesity   Renal/GU negative Renal ROS     Musculoskeletal negative musculoskeletal ROS (+)   Abdominal   Peds  Hematology negative hematology ROS (+)   Anesthesia Other Findings   Reproductive/Obstetrics                            Anesthesia Physical Anesthesia Plan  ASA: II  Anesthesia Plan: General   Post-op Pain Management:  Regional for Post-op pain   Induction: Intravenous  PONV Risk Score and Plan: 3 and Treatment may vary due to age or medical condition, Ondansetron and Dexamethasone  Airway Management Planned: LMA  Additional Equipment: None  Intra-op Plan:   Post-operative Plan: Extubation in OR  Informed Consent: I have reviewed the patients History and Physical, chart, labs and discussed the procedure including the risks, benefits and alternatives for the proposed anesthesia with the patient or authorized representative who has indicated his/her understanding and acceptance.     Dental advisory given  Plan Discussed with: CRNA and Anesthesiologist  Anesthesia Plan Comments:        Anesthesia Quick Evaluation

## 2018-08-03 NOTE — Progress Notes (Signed)
Assisted Dr. Mal Amabile with right, ultrasound guided, popliteal/saphenous, adductor canal block. Side rails up, monitors on throughout procedure. See vital signs in flow sheet. Tolerated Procedure well.

## 2018-08-03 NOTE — Op Note (Addendum)
Orthopaedic Surgery Operative Note (CSN: [id number])  Alyssa Burns  September 04, 1950 Date of Surgery: 08/03/2018   Diagnoses:  RIGHT BIMALLEOLAR ANKLE FRACTURE  Procedure: Right ankle open reduction internal fixation bimalleolar fracture   Operative Finding Successful completion of planned procedure.  Bone quality was relatively poor for the patient's health.  We placed a lag screw at the fibula however the purchase was poor but it ended up acting mostly as a position screw.  A good purchase with our remaining screws and had multiple locking screws distal to the fracture site.  The medial malleolus was not amenable to to screw fixation one screw was placed.  Post-operative plan: The patient will be NWB in splint for 1-2 weeks then transitioned to boot to remain TDWB for a total of 6 weeks due to poor bone quality.  The patient will be dc home.  DVT prophylaxis ASA 81mg  BID.  Pain control with PRN pain medication preferring oral medicines.  Follow up plan will be scheduled in approximately 7 days for incision check and XR.  Post-Op Diagnosis: Same Surgeons:Primary: Bjorn PippinVarkey,  T, MD Assistants:Brandon Marcell BarlowParry OPAC Location: [facility] OR ROOM 1 Anesthesia: General Antibiotics: Ancef 2g preop, Vancomycin 1000mg  locally  Tourniquet time: 60 Estimated Blood Loss: minimal Complications: None Specimens: None Implants: Implant Name Type Inv. Item Serial No. Manufacturer Lot No. LRB No. Used Action  SCREW LOW PROFILE 3.5X16 - [id number] Screw SCREW LOW PROFILE 3.5X16  [employer] STERILIZED ON TRAY Right 2 Implanted  PLATE DST LCK RT H4 - [id number] Plate PLATE DST LCK RT H4  [employer] STERILIZED ON TRAY Right 1 Implanted  SCREW LOCKING 2.7X14MM - [id number] Screw SCREW LOCKING 2.7X14MM  [employer] STERILIZED ON TRAY Right 4 Implanted  SCREW LOCKING 2.7X18MM - [id number] Screw SCREW LOCKING 2.7X18MM  [employer] STERILIZED ON TRAY Right 1 Implanted  SCREW LOCKING 3.5X14MM - [id number] Screw SCREW LOCKING  3.5X14MM  [employer] STERILIZED ON TRAY Right 2 Implanted  SCREW CANNULATED 4.0X40MM - [id number] Screw SCREW CANNULATED 4.0X40MM  [employer] STERILIZED ON TRAY Right 1 Implanted  WASHER - [id number] Orthopedic Implant WASHER  [employer] STERILIZED ON TRAY Right 1 Implanted    Indications for Surgery:   Alyssa Burns is a 68 y.o. female with fall resulting in ankle injury which she couldn't weightbear on.  She works as a Development worker, communityphysician in a nearby town.  Benefits and risks of operative and nonoperative management were discussed prior to surgery with patient/guardian(s) and informed consent form was completed.  Specific risks including infection, need for additional surgery, non-union, mal-union, periprosthetic fracture.     Procedure:   The patient was identified in the preoperative holding area where the surgical site was marked. The patient was taken to the OR where a procedural timeout was called and the above noted anesthesia was induced.  The patient was positioned supine on a bone foam.  Preoperative antibiotics were dosed.  The patient's right ankle was prepped and draped in the usual sterile fashion.  A second preoperative timeout was called.      A tourniquet was used for the above listed time.  We began with our ORIF of the fibula. A longitudinal approach was made along the lateral border of the fibula centered at the fracture site. We dissected down taking care to avoid the superficial peroneal nerve which crossed proximal to our incision. We encountered the fracture site and noted a oblique fracture. The bone quality was poor.  We are able to reduce it anatomically.  We identified that the fracture was amenable to lag screw fixation but the bite of this screw was poor.  We then filled multiple locking holes distal to the fracture site and 4 holes proximal achieving bicortical fixation with all screws. Two of these were locking proximal to the fracture. We confirmed anatomic reduction of  fluoroscopy and then turned our attention to the medial side.  An oblique incision was made over the anterior aspect of the medial malleolus were able to identify the fracture site itself. A point the fracture site was cleared of interposed periosteum and we were able to obtain an anatomic reduction was held with point-to-point clamp.  The fracture is too small to be amenable to 2 screws but one screw was able to be placed.  At this point we placed 1 partially threaded 40 mm cannulated screw with washer across the fracture site achieving good purchase and good compression.   The incision was thoroughly irrigated and closed in a multilayer fashion with absorbable sutures.  Local vancomycin was placed at the incision.  A sterile dressing was placed.  Well-padded splint was placed.  The patient was awoken from general anesthesia and taken to the PACU in stable condition without complication.   Janace Litten, OPA-C, present and scrubbed throughout the case, critical for completion in a timely fashion, and for retraction, instrumentation, closure.

## 2018-08-03 NOTE — Anesthesia Procedure Notes (Signed)
Procedure Name: LMA Insertion Date/Time: 08/03/2018 1:50 PM Performed by: Tyrone Nine, CRNA Pre-anesthesia Checklist: Patient identified, Emergency Drugs available, Suction available and Patient being monitored Patient Re-evaluated:Patient Re-evaluated prior to induction Oxygen Delivery Method: Circle system utilized Preoxygenation: Pre-oxygenation with 100% oxygen Induction Type: IV induction Ventilation: Mask ventilation without difficulty LMA: LMA inserted LMA Size: 4.0 Number of attempts: 1 Placement Confirmation: CO2 detector,  breath sounds checked- equal and bilateral and positive ETCO2 Tube secured with: Tape Dental Injury: Teeth and Oropharynx as per pre-operative assessment

## 2018-08-03 NOTE — Transfer of Care (Signed)
Immediate Anesthesia Transfer of Care Note  Patient: Alyssa Burns  Procedure(s) Performed: OPEN REDUCTION INTERNAL FIXATION (ORIF) BIMALLEOLAR RIGHT ANKLE FRACTURE (Right Ankle)  Patient Location: PACU  Anesthesia Type:General  Level of Consciousness: awake, alert , oriented and patient cooperative  Airway & Oxygen Therapy: Patient Spontanous Breathing and Patient connected to nasal cannula oxygen  Post-op Assessment: Report given to RN and Post -op Vital signs reviewed and stable  Post vital signs: Reviewed and stable  Last Vitals:  Vitals Value Taken Time  BP 118/71 08/03/2018  3:16 PM  Temp    Pulse 77 08/03/2018  3:16 PM  Resp 14 08/03/2018  3:16 PM  SpO2 99 % 08/03/2018  3:16 PM  Vitals shown include unvalidated device data.  Last Pain:  Vitals:   08/03/18 1210  TempSrc: Oral  PainSc: 0-No pain         Complications: No apparent anesthesia complications

## 2018-08-06 ENCOUNTER — Encounter (HOSPITAL_BASED_OUTPATIENT_CLINIC_OR_DEPARTMENT_OTHER): Payer: Self-pay | Admitting: Orthopaedic Surgery

## 2018-08-07 ENCOUNTER — Other Ambulatory Visit: Payer: Self-pay

## 2018-08-07 ENCOUNTER — Encounter: Payer: Self-pay | Admitting: Physician Assistant

## 2018-08-07 ENCOUNTER — Ambulatory Visit (INDEPENDENT_AMBULATORY_CARE_PROVIDER_SITE_OTHER): Payer: Medicare Other | Admitting: Physician Assistant

## 2018-08-07 DIAGNOSIS — I498 Other specified cardiac arrhythmias: Secondary | ICD-10-CM

## 2018-08-07 DIAGNOSIS — I491 Atrial premature depolarization: Secondary | ICD-10-CM | POA: Diagnosis not present

## 2018-08-07 DIAGNOSIS — R079 Chest pain, unspecified: Secondary | ICD-10-CM

## 2018-08-07 DIAGNOSIS — I499 Cardiac arrhythmia, unspecified: Secondary | ICD-10-CM

## 2018-08-07 NOTE — Progress Notes (Signed)
Telephone visit  Subjective: CC: Abnormal heart rhythm, per anesthesiologist PCP: Remus Loffler, PA-C PET:[id number] Alyssa Burns is a 68 y.o. female calls for telephone consult today. Patient provides verbal consent for consult held via phone.  Patient is identified with 2 separate identifiers.  At this time the entire area is on COVID-19 social distancing and stay home orders are in place.  Patient is of higher risk and therefore we are performing this by a virtual method.  Location of patient: work Location of provider: [facility] Others present for call: no  On Aug 03, 2018 the patient had to have an open reduction internal fixation of her right ankle.  The anesthesiologist reported to her that when she was coming out from the anesthesia, she was having some bigeminy rhythm and multiple PACs.  Eventually it did calm down.  But he did pass the information on to her so that she can have appropriate follow-up with cardiology.  At this time she does not really have any significant chest pain, shortness of breath, wheezing, edema.  There have been a few times where she felt like she may have been having some slight palpitations since she has become aware of this problem.  She has noticed that when she is at the top of a steep hill that she walks on a regular basis she has noticed some central chest pressure.  However she does recover quickly.  Her blood pressure readings have been good in recent checks.  She does have some family history of vascular disorder, her brother and his 11s had an MI, her father had lots of vascular issues including peripheral vascular disease with multiple surgeries and eventually a coronary bypass in his 84s, ultimately he did passed away from a congestive heart failure.  The patient has had long term obstructive sleep apnea but has had excellent control and is a faithful user of her machine.  She has had a lot of weight loss over the past year.   ROS: Per HPI   Allergies  Allergen Reactions  . Morphine And Related Itching  . Percocet [Oxycodone-Acetaminophen] Itching   Past Medical History:  Diagnosis Date  . Sleep apnea     Current Outpatient Medications:  .  acetaminophen (TYLENOL) 500 MG tablet, Take 2 tablets (1,000 mg total) by mouth every 8 (eight) hours for 14 days., Disp: 84 tablet, Rfl: 0 .  aspirin 81 MG tablet, Take 1 tablet (81 mg total) by mouth 2 (two) times daily., Disp: 90 tablet, Rfl: 0 .  celecoxib (CELEBREX) 100 MG capsule, Take 1 capsule (100 mg total) by mouth 2 (two) times daily., Disp: 60 capsule, Rfl: 2 .  Multiple Vitamins-Minerals (MULTIVITAL) tablet, Take 1 tablet by mouth daily., Disp: , Rfl:  .  ondansetron (ZOFRAN) 4 MG tablet, Take 1 tablet (4 mg total) by mouth every 8 (eight) hours as needed for up to 7 days for nausea or vomiting., Disp: 10 tablet, Rfl: 1 .  oxyCODONE (OXY IR/ROXICODONE) 5 MG immediate release tablet, Take 1-2 pills every 6 hrs as needed for pain, no more than 6 per day, Disp: 30 tablet, Rfl: 0  Assessment/ Plan: 68 y.o. female   1. PAC (premature atrial contraction) - Ambulatory referral to Cardiology  2. Bigeminy - Ambulatory referral to Cardiology  3. Exertional chest pain - Ambulatory referral to Cardiology   Start time: 3:01 PM End time: 3:12 PM  No orders of the defined types were placed in this encounter.  Particia Nearing PA-C [facility] [phone]

## 2018-08-08 ENCOUNTER — Telehealth: Payer: Self-pay | Admitting: Cardiovascular Disease

## 2018-08-08 NOTE — Telephone Encounter (Signed)
Virtual Visit Pre-Appointment Phone Call  "(Name), I am calling you today to discuss your upcoming appointment. We are currently trying to limit exposure to the virus that causes COVID-19 by seeing patients at home rather than in the office."   "What is the BEST phone number to call the day of the visit?" -    [phone]  1. Do you have or have access to (through a family member/friend) a smartphone with video capability that we can use for your visit?" a. If yes - list this number in appt notes as cell (if different from BEST phone #) and list the appointment type as a VIDEO visit in appointment notes b. If no - list the appointment type as a PHONE visit in appointment notes  2. Confirm consent - "In the setting of the current Covid19 crisis, you are scheduled for a (phone or video) visit with your provider on (date) at (time).  Just as we do with many in-office visits, in order for you to participate in this visit, we must obtain consent.  If you'd like, I can send this to your mychart (if signed up) or email for you to review.  Otherwise, I can obtain your verbal consent now.  All virtual visits are billed to your insurance company just like a normal visit would be.  By agreeing to a virtual visit, we'd like you to understand that the technology does not allow for your provider to perform an examination, and thus may limit your provider's ability to fully assess your condition. If your provider identifies any concerns that need to be evaluated in person, we will make arrangements to do so.  Finally, though the technology is pretty good, we cannot assure that it will always work on either your or our end, and in the setting of a video visit, we may have to convert it to a phone-only visit.  In either situation, we cannot ensure that we have a secure connection.  Are you willing to proceed?" STAFF: Did the patient verbally acknowledge consent to telehealth visit? Document YES/NO here:  YES    3. Advise patient to be prepared - "Two hours prior to your appointment, go ahead and check your blood pressure, pulse, oxygen saturation, and your weight (if you have the equipment to check those) and write them all down. When your visit starts, your provider will ask you for this information. If you have an Apple Watch or Kardia device, please plan to have heart rate information ready on the day of your appointment. Please have a pen and paper handy nearby the day of the visit as well."  4. Give patient instructions for MyChart download to smartphone OR Doximity/Doxy.me as below if video visit (depending on what platform provider is using)  5. Inform patient they will receive a phone call 15 minutes prior to their appointment time (may be from unknown caller ID) so they should be prepared to answer    TELEPHONE CALL NOTE  Alyssa Burns has been deemed a candidate for a follow-up tele-health visit to limit community exposure during the Covid-19 pandemic. I spoke with the patient via phone to ensure availability of phone/video source, confirm preferred email & phone number, and discuss instructions and expectations.  I reminded Alyssa Burns to be prepared with any vital sign and/or heart rhythm information that could potentially be obtained via home monitoring, at the time of her visit. I reminded Alyssa Burns to expect a phone call prior  to her visit.  Megan Salon 08/08/2018 4:41 PM   INSTRUCTIONS FOR DOWNLOADING THE MYCHART APP TO SMARTPHONE  - The patient must first make sure to have activated MyChart and know their login information - If Apple, go to [employer] and type in MyChart in the search bar and download the app. If Android, ask patient to go to [employer] and type in [place] in the search bar and download the app. The app is free but as with any other app downloads, their phone may require them to verify saved payment information or Apple/Android  password.  - The patient will need to then log into the app with their MyChart username and password, and select [facility] as their healthcare provider to link the account. When it is time for your visit, go to the MyChart app, find appointments, and click Begin Video Visit. Be sure to Select Allow for your device to access the Microphone and Camera for your visit. You will then be connected, and your provider will be with you shortly.  **If they have any issues connecting, or need assistance please contact MyChart service desk [phone])**  **If using a computer, in order to ensure the best quality for their visit they will need to use either of the following Internet Browsers: [employer] or Google Chrome**  IF USING DOXIMITY or DOXY.ME - The patient will receive a link just prior to their visit by text.     FULL LENGTH CONSENT FOR TELE-HEALTH VISIT   I hereby voluntarily request, consent and authorize [facility] and its employed or contracted physicians, physician assistants, nurse practitioners or other licensed health care professionals (the Practitioner), to provide me with telemedicine health care services (the Services") as deemed necessary by the treating Practitioner. I acknowledge and consent to receive the Services by the Practitioner via telemedicine. I understand that the telemedicine visit will involve communicating with the Practitioner through live audiovisual communication technology and the disclosure of certain medical information by electronic transmission. I acknowledge that I have been given the opportunity to request an in-person assessment or other available alternative prior to the telemedicine visit and am voluntarily participating in the telemedicine visit.  I understand that I have the right to withhold or withdraw my consent to the use of telemedicine in the course of my care at any time, without affecting my right to future care or treatment,  and that the Practitioner or I may terminate the telemedicine visit at any time. I understand that I have the right to inspect all information obtained and/or recorded in the course of the telemedicine visit and may receive copies of available information for a reasonable fee.  I understand that some of the potential risks of receiving the Services via telemedicine include:   Delay or interruption in medical evaluation due to technological equipment failure or disruption;  Information transmitted may not be sufficient (e.g. poor resolution of images) to allow for appropriate medical decision making by the Practitioner; and/or   In rare instances, security protocols could fail, causing a breach of personal health information.  Furthermore, I acknowledge that it is my responsibility to provide information about my medical history, conditions and care that is complete and accurate to the best of my ability. I acknowledge that Practitioner's advice, recommendations, and/or decision may be based on factors not within their control, such as incomplete or inaccurate data provided by me or distortions of diagnostic images or specimens that may result from electronic transmissions.  I understand that the practice of medicine is not an exact science and that Practitioner makes no warranties or guarantees regarding treatment outcomes. I acknowledge that I will receive a copy of this consent concurrently upon execution via email to the email address I last provided but may also request a printed copy by calling the office of [facility].    I understand that my insurance will be billed for this visit.   I have read or had this consent read to me.  I understand the contents of this consent, which adequately explains the benefits and risks of the Services being provided via telemedicine.   I have been provided ample opportunity to ask questions regarding this consent and the Services and have had my questions  answered to my satisfaction.  I give my informed consent for the services to be provided through the use of telemedicine in my medical care  By participating in this telemedicine visit I agree to the above.

## 2018-08-09 ENCOUNTER — Encounter: Payer: Self-pay | Admitting: Cardiovascular Disease

## 2018-08-10 ENCOUNTER — Telehealth (INDEPENDENT_AMBULATORY_CARE_PROVIDER_SITE_OTHER): Payer: Medicare Other | Admitting: Cardiovascular Disease

## 2018-08-10 ENCOUNTER — Encounter: Payer: Self-pay | Admitting: Cardiovascular Disease

## 2018-08-10 VITALS — BP 136/90 | HR 56 | Ht 62.0 in | Wt 175.0 lb

## 2018-08-10 DIAGNOSIS — R0602 Shortness of breath: Secondary | ICD-10-CM

## 2018-08-10 DIAGNOSIS — G4733 Obstructive sleep apnea (adult) (pediatric): Secondary | ICD-10-CM

## 2018-08-10 DIAGNOSIS — R0789 Other chest pain: Secondary | ICD-10-CM | POA: Diagnosis not present

## 2018-08-10 DIAGNOSIS — I499 Cardiac arrhythmia, unspecified: Secondary | ICD-10-CM

## 2018-08-10 MED ORDER — METOPROLOL TARTRATE 50 MG PO TABS: ORAL_TABLET | ORAL | 0 refills | Status: DC

## 2018-08-10 NOTE — Progress Notes (Signed)
Virtual Visit via Video Note   This visit type was conducted due to national recommendations for restrictions regarding the COVID-19 Pandemic (e.g. social distancing) in an effort to limit this patient's exposure and mitigate transmission in our community.  Due to her co-morbid illnesses, this patient is at least at moderate risk for complications without adequate follow up.  This format is felt to be most appropriate for this patient at this time.  All issues noted in this document were discussed and addressed.  A limited physical exam was performed with this format.  Please refer to the patient's chart for her consent to telehealth for [facility]   Date:  08/10/2018   ID:  Fanny Bien, DOB [birth year], MRN [id number]  Patient Location: Home Provider Location: Home  PCP:  Remus Loffler, PA-C  Cardiologist:  Dr. Purvis Sheffield (New) Electrophysiologist:  None   Evaluation Performed:  New Patient Evaluation  Chief Complaint:  Arrhythmia  History of Present Illness:    Shaneil Yazdi is a 68 y.o. female with a history of obstructive sleep apnea.  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  She recently underwent right ankle open reduction and internal fixation for a bimalleolar fracture.  Her PCP, Prudy Feeler PA-C, contacted me because she reportedly had a bigeminal rhythm with multiple PAC's as anesthetics were wearing off. There was also mention in office notes of occasional palpitations and some central chest pressure when walking up a steep hill which quickly resolves.  Upon speaking with her, she tells me she very seldom has palpitations. She had been walking prior to her injury (she fell going down her basement stairs as her sock slipped off). She is currently doing non-weight bearing exercises.  She denies orthopnea and paroxysmal nocturnal dyspnea. She confirmed that when walking in her neighborhood and going up an incline, she occasionally has some central chest  pressure and mild shortness of breath.  She had chest pain over 5 years ago and underwent a stress test at [facility] at that time.  The patient does not have symptoms concerning for COVID-19 infection (fever, chills, cough, or new shortness of breath).   Fam Hx: Mother passed away at age 70 in 05/20/2020from lung cancer. Father had CABG in his early 56's and has since passed away. She has a brother who developed heart disease and had an MI at age 60.  Soc Hx: Has been in practice since 1991-08-15. Her father was a physician as are some of her siblings. She grew up in [place] and then moved to [place] prior to high school.   Past Medical History:  Diagnosis Date  . Sleep apnea    Past Surgical History:  Procedure Laterality Date  . JOINT REPLACEMENT Left 01/2002   knee  . ORIF ANKLE FRACTURE Right 08/03/2018   Procedure: OPEN REDUCTION INTERNAL FIXATION (ORIF) BIMALLEOLAR RIGHT ANKLE FRACTURE;  Surgeon: Bjorn Pippin, MD;  Location: [facility];  Service: Orthopedics;  Laterality: Right;  . TONSILLECTOMY AND ADENOIDECTOMY    . TUBAL LIGATION       Current Meds  Medication Sig  . acetaminophen (TYLENOL) 500 MG tablet Take 2 tablets (1,000 mg total) by mouth every 8 (eight) hours for 14 days.  Marland Kitchen aspirin 81 MG tablet Take 1 tablet (81 mg total) by mouth 2 (two) times daily.  . magnesium gluconate (MAGONATE) 500 MG tablet Take 1,000 mg by mouth at bedtime.  . Multiple Vitamins-Minerals (MULTIVITAL) tablet Take 1 tablet  by mouth daily.     Allergies:   Morphine and related and Percocet [oxycodone-acetaminophen]   Social History   Tobacco Use  . Smoking status: Never Smoker  . Smokeless tobacco: Never Used  Substance Use Topics  . Alcohol use: No  . Drug use: No     Family Hx: The patient's family history includes Arthritis in her father and mother; Cancer in her father; Diabetes in her father; Hearing loss in her mother; Heart disease in her brother and  father.  ROS:   Please see the history of present illness.     All other systems reviewed and are negative.   Prior CV studies:   The following studies were reviewed today:  NA  Labs/Other Tests and Data Reviewed:    EKG:  An ECG dated 08/09/2018 was personally reviewed today and demonstrated:  Sinus bradycardia, 53 bpm  Recent Labs: 05/11/2018: ALT 20; BUN 15; Creatinine, Ser 0.74; Hemoglobin 13.9; Platelets 201; Potassium 3.9; Sodium 142; TSH 2.450   Recent Lipid Panel Lab Results  Component Value Date/Time   CHOL 197 06/10/2016 12:16 PM   TRIG 200 (H) 06/10/2016 12:16 PM   HDL 48 06/10/2016 12:16 PM   CHOLHDL 4.1 06/10/2016 12:16 PM   LDLCALC 109 (H) 06/10/2016 12:16 PM    Wt Readings from Last 3 Encounters:  08/10/18 175 lb (79.4 kg)  08/03/18 170 lb 6.7 oz (77.3 kg)  05/11/18 176 lb 9.6 oz (80.1 kg)     Objective:    Vital Signs:  BP 136/90   Pulse (!) 56   Ht 5\' 2"  (1.575 m)   Wt 175 lb (79.4 kg)   BMI 32.01 kg/m    VITAL SIGNS:  reviewed GEN:  no acute distress EYES:  sclerae anicteric, EOMI - Extraocular Movements Intact RESPIRATORY:  normal respiratory effort, symmetric expansion CARDIOVASCULAR:  no peripheral edema MUSCULOSKELETAL:  Right foot in boot. NEURO:  alert and oriented x 3, no obvious focal deficit PSYCH:  normal affect  ASSESSMENT & PLAN:    1. Arrhythmia: She appeared to have a bigeminal rhythm and atrial ectopy as anesthesia was wearing off. She has a history of sleep apnea and is mildly bradycardic. She is certainly at risk for arrhythmia development albeit she uses CPAP regularly. Given that palpitations are seldom in occurrence, I think an event monitor would be of low yield at this time.  I will order a 2-D echocardiogram with Doppler to evaluate cardiac structure, function, and regional wall motion.  2. Chest pressure and shortness of breath: This appears to occur with more strenuous exertion. There is family history of ischemic  heart disease. I will obtain coronary CT angiography to evaluate for obstructive coronary artery disease.  3. OSA: Uses CPAP regularly.    COVID-19 Education: The signs and symptoms of COVID-19 were discussed with the patient and how to seek care for testing (follow up with PCP or arrange E-visit).  The importance of social distancing was discussed today.  Time:   Today, I have spent 30 minutes with the patient with telehealth technology discussing the above problems, reviewing the EMR, and arranging further management.     Medication Adjustments/Labs and Tests Ordered: Current medicines are reviewed at length with the patient today.  Concerns regarding medicines are outlined above.   Tests Ordered: No orders of the defined types were placed in this encounter.   Medication Changes: No orders of the defined types were placed in this encounter.   Disposition:  Follow up  in 6 week(s)  Signed, Prentice DockerSuresh Koneswaran, MD  08/10/2018 3:17 PM    [facility]

## 2018-08-10 NOTE — Addendum Note (Signed)
Addended by: Marlyn Corporal A on: 08/10/2018 04:26 PM   Modules accepted: Orders

## 2018-08-10 NOTE — Patient Instructions (Signed)
Medication Instructions: Your physician recommends that you continue on your current medications as directed. Please refer to the Current Medication list given to you today.   Labwork: None today  Procedures/Testing: Your physician has requested that you have an echocardiogram. Echocardiography is a painless test that uses sound waves to create images of your heart. It provides your doctor with information about the size and shape of your heart and how well your heart's chambers and valves are working. This procedure takes approximately one hour. There are no restrictions for this procedure.  Your physician has requested that you have cardiac CT. Cardiac computed tomography (CT) is a painless test that uses an x-ray machine to take clear, detailed pictures of your heart. For further information please visit [email or web address] Please follow instruction sheet as given.    Follow-Up: 6 weeks video Telehealth apt with Dr.Koneswaran  Any Additional Special Instructions Will Be Listed Below (If Applicable).     If you need a refill on your cardiac medications before your next appointment, please call your pharmacy.

## 2018-08-10 NOTE — Addendum Note (Signed)
Addended by: Marlyn Corporal A on: 08/10/2018 04:54 PM   Modules accepted: Orders

## 2018-08-15 ENCOUNTER — Telehealth: Payer: Self-pay | Admitting: Physician Assistant

## 2018-08-15 NOTE — Telephone Encounter (Signed)
Denied awv

## 2018-08-23 ENCOUNTER — Other Ambulatory Visit: Payer: Self-pay

## 2018-08-23 ENCOUNTER — Ambulatory Visit (HOSPITAL_COMMUNITY)
Admission: RE | Admit: 2018-08-23 | Discharge: 2018-08-23 | Disposition: A | Discharge status: Home / Self Care | Payer: Medicare Other | Source: Ambulatory Visit | Attending: Cardiovascular Disease | Admitting: Cardiovascular Disease

## 2018-08-23 ENCOUNTER — Telehealth: Payer: Medicare Other | Admitting: Cardiovascular Disease

## 2018-08-23 DIAGNOSIS — I499 Cardiac arrhythmia, unspecified: Secondary | ICD-10-CM | POA: Diagnosis present

## 2018-08-23 DIAGNOSIS — R0789 Other chest pain: Secondary | ICD-10-CM | POA: Insufficient documentation

## 2018-08-23 DIAGNOSIS — R030 Elevated blood-pressure reading, without diagnosis of hypertension: Secondary | ICD-10-CM | POA: Insufficient documentation

## 2018-08-23 NOTE — Progress Notes (Signed)
*  PRELIMINARY RESULTS* Echocardiogram 2D Echocardiogram has been performed.  Alyssa Burns 08/23/2018, 3:37 PM

## 2018-08-31 ENCOUNTER — Telehealth (HOSPITAL_COMMUNITY): Payer: Self-pay | Admitting: Emergency Medicine

## 2018-08-31 NOTE — Telephone Encounter (Signed)
Left message on voicemail with name and callback number Carletha Dawn RN Navigator Cardiac Imaging [facility] [phone] Office [phone] Cell  

## 2018-08-31 NOTE — Telephone Encounter (Signed)
Pt returned phone call regarding upcoming cardiac imaging study; pt verbalizes understanding of appt date/time, parking situation and where to check in, pre-test NPO status and medications ordered, and verified current allergies; name and call back number provided for further questions should they arise Jaqua Ching RN Navigator Cardiac Imaging [facility] [phone] office [phone] cell  Pt denies covid symptoms, verbalized understanding of visitor policy. 

## 2018-09-04 ENCOUNTER — Ambulatory Visit (HOSPITAL_COMMUNITY)
Admission: RE | Admit: 2018-09-04 | Discharge: 2018-09-04 | Disposition: A | Discharge status: Home / Self Care | Payer: Medicare Other | Source: Ambulatory Visit | Attending: Cardiovascular Disease | Admitting: Cardiovascular Disease

## 2018-09-04 ENCOUNTER — Other Ambulatory Visit: Payer: Self-pay

## 2018-09-04 DIAGNOSIS — K449 Diaphragmatic hernia without obstruction or gangrene: Secondary | ICD-10-CM | POA: Diagnosis not present

## 2018-09-04 DIAGNOSIS — R0602 Shortness of breath: Secondary | ICD-10-CM

## 2018-09-04 DIAGNOSIS — I25118 Atherosclerotic heart disease of native coronary artery with other forms of angina pectoris: Secondary | ICD-10-CM | POA: Diagnosis not present

## 2018-09-04 DIAGNOSIS — R0789 Other chest pain: Secondary | ICD-10-CM | POA: Diagnosis not present

## 2018-09-04 DIAGNOSIS — I2510 Atherosclerotic heart disease of native coronary artery without angina pectoris: Secondary | ICD-10-CM | POA: Diagnosis not present

## 2018-09-04 MED ORDER — METOPROLOL TARTRATE 5 MG/5ML IV SOLN
INTRAVENOUS | Status: AC
  Filled 2018-09-04: qty 5

## 2018-09-04 MED ORDER — NITROGLYCERIN 0.4 MG SL SUBL
SUBLINGUAL_TABLET | SUBLINGUAL | Status: AC
  Filled 2018-09-04: qty 2

## 2018-09-04 MED ORDER — NITROGLYCERIN 0.4 MG SL SUBL
0.8000 mg | SUBLINGUAL_TABLET | Freq: Once | SUBLINGUAL | Status: AC
  Administered 2018-09-04: 13:00:00 0.8 mg via SUBLINGUAL
  Filled 2018-09-04: qty 25

## 2018-09-04 MED ORDER — IOHEXOL 350 MG/ML SOLN
100.0000 mL | Freq: Once | INTRAVENOUS | Status: AC | PRN
  Administered 2018-09-04: 100 mL via INTRAVENOUS

## 2018-09-04 MED ORDER — METOPROLOL TARTRATE 5 MG/5ML IV SOLN
5.0000 mg | INTRAVENOUS | Status: DC | PRN
  Filled 2018-09-04: qty 5

## 2018-09-04 NOTE — Progress Notes (Signed)
CT scan completed. Tolerated well. D/C home walking, awake and alert. In no distress. 

## 2018-09-05 ENCOUNTER — Telehealth: Payer: Self-pay | Admitting: Cardiovascular Disease

## 2018-09-05 NOTE — Telephone Encounter (Signed)
I called Dr. Melina Copa and discussed the results.

## 2018-09-05 NOTE — Telephone Encounter (Signed)
Patient called stating that she was returning a call to Dr. Bronson Ing.

## 2018-09-06 ENCOUNTER — Other Ambulatory Visit: Payer: Self-pay | Admitting: Cardiovascular Disease

## 2018-09-06 DIAGNOSIS — I25118 Atherosclerotic heart disease of native coronary artery with other forms of angina pectoris: Secondary | ICD-10-CM

## 2018-09-06 MED ORDER — SODIUM CHLORIDE 0.9% FLUSH: 3.0000 mL | Freq: Two times a day (BID) | INTRAVENOUS | Status: DC

## 2018-09-11 ENCOUNTER — Other Ambulatory Visit (HOSPITAL_COMMUNITY)
Admission: RE | Admit: 2018-09-11 | Discharge: 2018-09-11 | Disposition: A | Discharge status: Home / Self Care | Payer: Medicare Other | Source: Ambulatory Visit | Attending: Internal Medicine | Admitting: Internal Medicine

## 2018-09-11 ENCOUNTER — Other Ambulatory Visit: Payer: Self-pay

## 2018-09-11 DIAGNOSIS — Z1159 Encounter for screening for other viral diseases: Secondary | ICD-10-CM | POA: Insufficient documentation

## 2018-09-12 LAB — NOVEL CORONAVIRUS, NAA (HOSP ORDER, SEND-OUT TO REF LAB; TAT 18-24 HRS): SARS-CoV-2, NAA: NOT DETECTED

## 2018-09-14 ENCOUNTER — Ambulatory Visit (HOSPITAL_COMMUNITY)
Admission: RE | Admit: 2018-09-14 | Discharge: 2018-09-15 | Disposition: A | Discharge status: Home / Self Care | Payer: Medicare Other | Attending: Cardiovascular Disease | Admitting: Cardiovascular Disease

## 2018-09-14 ENCOUNTER — Encounter (HOSPITAL_COMMUNITY)
Admission: RE | Disposition: A | Discharge status: Home / Self Care | Payer: Self-pay | Source: Home / Self Care | Attending: Cardiovascular Disease

## 2018-09-14 ENCOUNTER — Other Ambulatory Visit: Payer: Self-pay

## 2018-09-14 DIAGNOSIS — R001 Bradycardia, unspecified: Secondary | ICD-10-CM | POA: Insufficient documentation

## 2018-09-14 DIAGNOSIS — I200 Unstable angina: Secondary | ICD-10-CM | POA: Diagnosis present

## 2018-09-14 DIAGNOSIS — Z96652 Presence of left artificial knee joint: Secondary | ICD-10-CM | POA: Insufficient documentation

## 2018-09-14 DIAGNOSIS — R079 Chest pain, unspecified: Secondary | ICD-10-CM | POA: Diagnosis not present

## 2018-09-14 DIAGNOSIS — Z8249 Family history of ischemic heart disease and other diseases of the circulatory system: Secondary | ICD-10-CM | POA: Diagnosis not present

## 2018-09-14 DIAGNOSIS — Z7982 Long term (current) use of aspirin: Secondary | ICD-10-CM | POA: Diagnosis not present

## 2018-09-14 DIAGNOSIS — Z885 Allergy status to narcotic agent status: Secondary | ICD-10-CM | POA: Diagnosis not present

## 2018-09-14 DIAGNOSIS — I25118 Atherosclerotic heart disease of native coronary artery with other forms of angina pectoris: Secondary | ICD-10-CM | POA: Diagnosis not present

## 2018-09-14 DIAGNOSIS — G4733 Obstructive sleep apnea (adult) (pediatric): Secondary | ICD-10-CM | POA: Insufficient documentation

## 2018-09-14 DIAGNOSIS — I25110 Atherosclerotic heart disease of native coronary artery with unstable angina pectoris: Secondary | ICD-10-CM | POA: Diagnosis not present

## 2018-09-14 DIAGNOSIS — Z9851 Tubal ligation status: Secondary | ICD-10-CM | POA: Insufficient documentation

## 2018-09-14 DIAGNOSIS — R0602 Shortness of breath: Secondary | ICD-10-CM | POA: Diagnosis not present

## 2018-09-14 DIAGNOSIS — Z955 Presence of coronary angioplasty implant and graft: Secondary | ICD-10-CM

## 2018-09-14 HISTORY — PX: CORONARY BALLOON ANGIOPLASTY: CATH118233

## 2018-09-14 HISTORY — PX: LEFT HEART CATH AND CORONARY ANGIOGRAPHY: CATH118249

## 2018-09-14 HISTORY — DX: Atherosclerotic heart disease of native coronary artery without angina pectoris: I25.10

## 2018-09-14 HISTORY — PX: CORONARY STENT INTERVENTION: CATH118234

## 2018-09-14 LAB — BASIC METABOLIC PANEL
Anion gap: 8 (ref 5–15)
BUN: 23 mg/dL (ref 8–23)
CO2: 27 mmol/L (ref 22–32)
Calcium: 9.3 mg/dL (ref 8.9–10.3)
Chloride: 105 mmol/L (ref 98–111)
Creatinine, Ser: 0.89 mg/dL (ref 0.44–1.00)
GFR calc Af Amer: >60 mL/min (ref >60)
GFR calc non Af Amer: >60 mL/min (ref >60)
Glucose, Bld: 100 mg/dL — ABNORMAL HIGH (ref 70–99)
Potassium: 3.9 mmol/L (ref 3.5–5.1)
Sodium: 140 mmol/L (ref 135–145)

## 2018-09-14 LAB — CBC
HCT: 43 % (ref 36.0–46.0)
Hemoglobin: 14.2 g/dL (ref 12.0–15.0)
MCH: 28 pg (ref 26.0–34.0)
MCHC: 33 g/dL (ref 30.0–36.0)
MCV: 84.8 fL (ref 80.0–100.0)
Platelets: 205 10*3/uL (ref 150–400)
RBC: 5.07 MIL/uL (ref 3.87–5.11)
RDW: 13.2 % (ref 11.5–15.5)
WBC: 5.5 10*3/uL (ref 4.0–10.5)
nRBC: 0 % (ref 0.0–0.2)

## 2018-09-14 LAB — POCT ACTIVATED CLOTTING TIME
Activated Clotting Time: 307 seconds
Activated Clotting Time: 351 seconds

## 2018-09-14 SURGERY — LEFT HEART CATH AND CORONARY ANGIOGRAPHY
Anesthesia: LOCAL

## 2018-09-14 MED ORDER — NITROGLYCERIN 1 MG/10 ML FOR IR/CATH LAB
INTRA_ARTERIAL | Status: AC
  Filled 2018-09-14: qty 10

## 2018-09-14 MED ORDER — HEPARIN (PORCINE) IN NACL 1000-0.9 UT/500ML-% IV SOLN
INTRAVENOUS | Status: DC | PRN
  Administered 2018-09-14 (×3): 500 mL

## 2018-09-14 MED ORDER — HYDRALAZINE HCL 20 MG/ML IJ SOLN: 10.0000 mg | INTRAMUSCULAR | Status: AC | PRN

## 2018-09-14 MED ORDER — LIDOCAINE HCL (PF) 1 % IJ SOLN
INTRAMUSCULAR | Status: AC
  Filled 2018-09-14: qty 30

## 2018-09-14 MED ORDER — SODIUM CHLORIDE 0.9 % WEIGHT BASED INFUSION
3.0000 mL/kg/h | INTRAVENOUS | Status: DC
  Administered 2018-09-14: 3 mL/kg/h via INTRAVENOUS

## 2018-09-14 MED ORDER — CLOPIDOGREL BISULFATE 300 MG PO TABS
ORAL_TABLET | ORAL | Status: DC | PRN
  Administered 2018-09-14: 600 mg via ORAL

## 2018-09-14 MED ORDER — SODIUM CHLORIDE 0.9% FLUSH: 3.0000 mL | INTRAVENOUS | Status: DC | PRN

## 2018-09-14 MED ORDER — HEPARIN (PORCINE) IN NACL 1000-0.9 UT/500ML-% IV SOLN
INTRAVENOUS | Status: AC
  Filled 2018-09-14: qty 1000

## 2018-09-14 MED ORDER — ASPIRIN 81 MG PO CHEW
81.0000 mg | CHEWABLE_TABLET | ORAL | Status: AC
  Administered 2018-09-14: 81 mg via ORAL

## 2018-09-14 MED ORDER — HEPARIN SODIUM (PORCINE) 1000 UNIT/ML IJ SOLN
INTRAMUSCULAR | Status: DC | PRN
  Administered 2018-09-14: 8000 [IU] via INTRAVENOUS

## 2018-09-14 MED ORDER — LABETALOL HCL 5 MG/ML IV SOLN: 10.0000 mg | INTRAVENOUS | Status: AC | PRN

## 2018-09-14 MED ORDER — SODIUM CHLORIDE 0.9% FLUSH: 3.0000 mL | Freq: Two times a day (BID) | INTRAVENOUS | Status: DC

## 2018-09-14 MED ORDER — CLOPIDOGREL BISULFATE 75 MG PO TABS
75.0000 mg | ORAL_TABLET | Freq: Every day | ORAL | Status: DC
  Administered 2018-09-15: 75 mg via ORAL
  Filled 2018-09-14: qty 1

## 2018-09-14 MED ORDER — LIDOCAINE HCL (PF) 1 % IJ SOLN
INTRAMUSCULAR | Status: DC | PRN
  Administered 2018-09-14: 2 mL

## 2018-09-14 MED ORDER — FENTANYL CITRATE (PF) 100 MCG/2ML IJ SOLN
INTRAMUSCULAR | Status: AC
  Filled 2018-09-14: qty 2

## 2018-09-14 MED ORDER — VERAPAMIL HCL 2.5 MG/ML IV SOLN
INTRAVENOUS | Status: AC
  Filled 2018-09-14: qty 2

## 2018-09-14 MED ORDER — ASPIRIN 81 MG PO CHEW
81.0000 mg | CHEWABLE_TABLET | Freq: Every day | ORAL | Status: DC
  Administered 2018-09-15: 81 mg via ORAL
  Filled 2018-09-14: qty 1

## 2018-09-14 MED ORDER — IOHEXOL 350 MG/ML SOLN
INTRAVENOUS | Status: DC | PRN
  Administered 2018-09-14: 145 mL via INTRAVENOUS

## 2018-09-14 MED ORDER — ANGIOPLASTY BOOK
Freq: Once | Status: AC
  Administered 2018-09-14: 21:00:00
  Filled 2018-09-14: qty 1

## 2018-09-14 MED ORDER — HEPARIN SODIUM (PORCINE) 1000 UNIT/ML IJ SOLN
INTRAMUSCULAR | Status: AC
  Filled 2018-09-14: qty 1

## 2018-09-14 MED ORDER — SODIUM CHLORIDE 0.9 % WEIGHT BASED INFUSION: 1.0000 mL/kg/h | INTRAVENOUS | Status: DC

## 2018-09-14 MED ORDER — VERAPAMIL HCL 2.5 MG/ML IV SOLN
INTRAVENOUS | Status: DC | PRN
  Administered 2018-09-14: 10 mL via INTRA_ARTERIAL

## 2018-09-14 MED ORDER — NITROGLYCERIN 1 MG/10 ML FOR IR/CATH LAB
INTRA_ARTERIAL | Status: DC | PRN
  Administered 2018-09-14: 200 ug via INTRACORONARY

## 2018-09-14 MED ORDER — HEPARIN SODIUM (PORCINE) 1000 UNIT/ML IJ SOLN
INTRAMUSCULAR | Status: DC | PRN
  Administered 2018-09-14: 3500 [IU] via INTRAVENOUS

## 2018-09-14 MED ORDER — SODIUM CHLORIDE 0.9 % IV SOLN: 250.0000 mL | INTRAVENOUS | Status: DC | PRN

## 2018-09-14 MED ORDER — ASPIRIN 81 MG PO CHEW
CHEWABLE_TABLET | ORAL | Status: AC
  Administered 2018-09-14: 81 mg via ORAL
  Filled 2018-09-14: qty 1

## 2018-09-14 MED ORDER — MIDAZOLAM HCL 2 MG/2ML IJ SOLN
INTRAMUSCULAR | Status: AC
  Filled 2018-09-14: qty 2

## 2018-09-14 MED ORDER — MIDAZOLAM HCL 2 MG/2ML IJ SOLN
INTRAMUSCULAR | Status: DC | PRN
  Administered 2018-09-14: 2 mg via INTRAVENOUS

## 2018-09-14 MED ORDER — SODIUM CHLORIDE 0.9 % IV SOLN: INTRAVENOUS | Status: AC

## 2018-09-14 MED ORDER — CLOPIDOGREL BISULFATE 300 MG PO TABS
ORAL_TABLET | ORAL | Status: AC
  Filled 2018-09-14: qty 2

## 2018-09-14 MED ORDER — FENTANYL CITRATE (PF) 100 MCG/2ML IJ SOLN
INTRAMUSCULAR | Status: DC | PRN
  Administered 2018-09-14: 25 ug via INTRAVENOUS

## 2018-09-14 SURGICAL SUPPLY — 18 items
BALLN SAPPHIRE 2.25X15 (BALLOONS) ×2
BALLN SAPPHIRE 2.5X12 (BALLOONS) ×2
BALLN SAPPHIRE ~~LOC~~ 2.5X12 (BALLOONS) ×2 IMPLANT
BALLOON SAPPHIRE 2.25X15 (BALLOONS) ×1 IMPLANT
BALLOON SAPPHIRE 2.5X12 (BALLOONS) ×1 IMPLANT
CATH IMPULSE 5F ANG/FL3.5 (CATHETERS) ×2 IMPLANT
CATH VISTA GUIDE 6FR XBLAD3.5 (CATHETERS) ×2 IMPLANT
DEVICE RAD COMP TR BAND LRG (VASCULAR PRODUCTS) ×2 IMPLANT
GLIDESHEATH SLEND SS 6F .021 (SHEATH) ×2 IMPLANT
GUIDEWIRE INQWIRE 1.5J.035X260 (WIRE) ×1 IMPLANT
INQWIRE 1.5J .035X260CM (WIRE) ×2
KIT ENCORE 26 ADVANTAGE (KITS) ×2 IMPLANT
KIT HEART LEFT (KITS) ×2 IMPLANT
PACK CARDIAC CATHETERIZATION (CUSTOM PROCEDURE TRAY) ×2 IMPLANT
STENT SYNERGY DES 2.25X20 (Permanent Stent) ×2 IMPLANT
TRANSDUCER W/STOPCOCK (MISCELLANEOUS) ×2 IMPLANT
TUBING CIL FLEX 10 FLL-RA (TUBING) ×2 IMPLANT
WIRE COUGAR XT STRL 190CM (WIRE) ×4 IMPLANT

## 2018-09-14 NOTE — Interval H&P Note (Signed)
History and Physical Interval Note:  09/14/2018 12:37 PM  Alyssa Burns  has presented today for surgery, with the diagnosis of angina.  The various methods of treatment have been discussed with the patient and family. After consideration of risks, benefits and other options for treatment, the patient has consented to  Procedure(s): LEFT HEART CATH AND CORONARY ANGIOGRAPHY (N/A) and possible percutaneous coronary angioplasty as a surgical intervention.  The patient's history has been reviewed, patient examined, no change in status, stable for surgery.  I have reviewed the patient's chart and labs.  Questions were answered to the patient's satisfaction.     Daniel Bensimhon

## 2018-09-14 NOTE — Progress Notes (Signed)
TR band repositioned by Sherlyn Lick, 12 cc instilled into band. Rt hand not as ruddy, warm w/brisk capillary refill. Rt arm continues to be elevated on pillow. Good pleth.

## 2018-09-14 NOTE — Progress Notes (Signed)
TR BAND REMOVAL  LOCATION:    right radial  DEFLATED PER PROTOCOL:    Yes.    TIME BAND OFF / DRESSING APPLIED:    2130   SITE UPON ARRIVAL:    Level 1  SITE AFTER BAND REMOVAL:    Level 1 (bruised)  CIRCULATION SENSATION AND MOVEMENT:    Within Normal Limits   Yes.    COMMENTS:   Post Tr band instructions given, sterile dressing placed, good capillary refill.

## 2018-09-14 NOTE — H&P (Signed)
History & Physical    Patient ID: Alyssa Burns MRN: [id number], DOB/AGE: [birth year]   Admit date: 09/14/2018   Primary Physician: Remus LofflerJones, Angel S, PA-C Primary Cardiologist: Dr. Purvis SheffieldKoneswaran ([place])  Patient Profile    Alyssa BienCynthia Lou Partin is a 68 y.o. female with a history of obstructive sleep apnea who was seen by Dr. Darl HouseholderKoneswaren on 08/10/2018 and scheduled for a coronary CT for complaints of exertional chest discomfort. CTA performed on 09/04/2018 showed Positive FFR CT with mid LAD and D1 estimated at 0.55 Therefore she was referred for heart catheterization scheduled for 09/14/2018.   Past Medical History   Past Medical History:  Diagnosis Date  . Sleep apnea     Past Surgical History:  Procedure Laterality Date  . JOINT REPLACEMENT Left 01/2002   knee  . ORIF ANKLE FRACTURE Right 08/03/2018   Procedure: OPEN REDUCTION INTERNAL FIXATION (ORIF) BIMALLEOLAR RIGHT ANKLE FRACTURE;  Surgeon: Bjorn PippinVarkey, Dax T, MD;  Location: [facility];  Service: Orthopedics;  Laterality: Right;  . TONSILLECTOMY AND ADENOIDECTOMY    . TUBAL LIGATION      Allergies  Allergies  Allergen Reactions  . Morphine And Related Itching  . Percocet [Oxycodone-Acetaminophen] Itching   History of Present Illness    [place] a [place].  She recently underwent right ankle open reduction and internal fixation for a bimalleolar fracture.  Her PCP, Prudy FeelerAngel Jones PA-C, contacted Dr. Purvis SheffieldKoneswaran because she reportedly had a bigeminal rhythm with multiple PAC's as anesthetics were wearing off. There was also mention in office notes of occasional palpitations and some central chest pressure when walking up a steep hill which quickly resolves.  During her office visit, she reported that she very seldom has palpitations. She had been walking prior to her injury (she fell going down her basement stairs as her sock slipped off). She is currently doing non-weight bearing exercises.   She denies orthopnea and paroxysmal nocturnal dyspnea. She confirmed that when walking in her neighborhood and going up an incline, she occasionally has some central chest pressure and mild shortness of breath.  She had chest pain over 5 years ago and underwent a stress test at [facility] at that time.   The patient does not have symptoms concerning for COVID-19 infection (fever, chills, cough, or new shortness of breath).   Home Medications    Prior to Admission medications   Medication Sig Start Date End Date Taking? Authorizing Provider  celecoxib (CELEBREX) 100 MG capsule Take 100 mg by mouth 2 (two) times daily as needed for moderate pain.   Yes [provider]  Coenzyme Q10 (COQ-10 PO) Take 250 mg by mouth daily.   Yes [provider]  magnesium gluconate (MAGONATE) 500 MG tablet Take 1,000 mg by mouth at bedtime.   Yes [provider]  Multiple Vitamins-Minerals (MULTIVITAL) tablet Take 1 tablet by mouth daily.   Yes Alver FisherLindner, Jodi N, RN  aspirin 81 MG tablet Take 1 tablet (81 mg total) by mouth 2 (two) times daily. Patient not taking: Reported on 09/11/2018 08/03/18 09/17/18  Bjorn PippinVarkey, Dax T, MD    Family History    Family History  Problem Relation Age of Onset  . Arthritis Mother   . Hearing loss Mother   . Arthritis Father   . Cancer Father        prostate  . Diabetes Father   . Heart disease Father   . Heart disease Brother     Social History  Social History   Socioeconomic History  . Marital status: Widowed    Spouse name: Not on file  . Number of children: Not on file  . Years of education: Not on file  . Highest education level: Not on file  Occupational History  . Not on file  Social Needs  . Financial resource strain: Not on file  . Food insecurity    Worry: Not on file    Inability: Not on file  . Transportation needs    Medical: Not on file    Non-medical: Not on file  Tobacco Use  . Smoking status: Never Smoker   . Smokeless tobacco: Never Used  Substance and Sexual Activity  . Alcohol use: No  . Drug use: No  . Sexual activity: Not on file  Lifestyle  . Physical activity    Days per week: Not on file    Minutes per session: Not on file  . Stress: Not on file  Relationships  . Social Herbalist on phone: Not on file    Gets together: Not on file    Attends religious service: Not on file    Active member of club or organization: Not on file    Attends meetings of clubs or organizations: Not on file    Relationship status: Not on file  . Intimate partner violence    Fear of current or ex partner: Not on file    Emotionally abused: Not on file    Physically abused: Not on file    Forced sexual activity: Not on file  Other Topics Concern  . Not on file  Social History Narrative  . Not on file     Review of Systems   See HPI   All other systems reviewed and are otherwise negative except as noted above.  Physical Exam    Blood pressure (!) 151/83, pulse 84, temperature (!) 97 F (36.1 C), temperature source Skin, resp. rate 16, height 5\' 2"  (1.575 m), weight 74.8 kg, SpO2 98 %.  General:  Well appearing. No resp difficulty HEENT: normal Neck: supple. no JVD. Carotids 2+ bilat; no bruits. No lymphadenopathy or thryomegaly appreciated. Cor: PMI nondisplaced. Regular rate & rhythm. No rubs, gallops or murmurs. Lungs: clear Abdomen: soft, nontender, nondistended. No hepatosplenomegaly. No bruits or masses. Good bowel sounds. Extremities: no cyanosis, clubbing, rash, edema Neuro: alert & orientedx3, cranial nerves grossly intact. moves all 4 extremities w/o difficulty. Affect pleasant   Labs    Troponin (Point of Care Test) No results for input(s): TROPIPOC in the last 72 hours. No results for input(s): CKTOTAL, CKMB, TROPONINI in the last 72 hours. Lab Results  Component Value Date   WBC 5.3 05/11/2018   HGB 13.9 05/11/2018   HCT 41.5 05/11/2018   MCV 84 05/11/2018    PLT 201 05/11/2018   No results for input(s): NA, K, CL, CO2, BUN, CREATININE, CALCIUM, PROT, BILITOT, ALKPHOS, ALT, AST, GLUCOSE in the last 168 hours.  Invalid input(s): LABALBU Lab Results  Component Value Date   CHOL 197 06/10/2016   HDL 48 06/10/2016   LDLCALC 109 (H) 06/10/2016   TRIG 200 (H) 06/10/2016   No results found for: [facility]   Radiology Studies    Ct Coronary Morph W/cta Cor W/score W/ca W/cm &/or Wo/cm  Addendum Date: 09/04/2018   ADDENDUM REPORT: 09/04/2018 14:35 EXAM: OVER-READ INTERPRETATION  CT CHEST The following report is an over-read performed by radiologist Dr. Eben Burow [facility] on  09/04/2018. This over-read does not include interpretation of cardiac or coronary anatomy or pathology. The coronary calcium score/coronary CTA interpretation by the cardiologist is attached. COMPARISON:  02/08/2012 FINDINGS: The visualized portions of the lower lung fields show no suspicious nodules, masses, or infiltrates. The visualized portions of the mediastinum show a small hiatal hernia, without significant change since previous study. IMPRESSION: Stable small hiatal hernia. Otherwise no significant non-cardiac abnormality in visualized portion of the thorax. Electronically Signed   By: Myles RosenthalJohn  Stahl M.D.   On: 09/04/2018 14:35   Result Date: 09/04/2018 CLINICAL DATA:  Chest pain EXAM: Cardiac CTA MEDICATIONS: Sub lingual nitro. 4 mg and lopressor 50mg  PO TECHNIQUE: The patient was scanned on a [employer] Force 192 scanner. Gantry rotation speed was 250 msecs. Collimation was. 6 mm . A 120 kV prospective scan was triggered in the ascending thoracic aorta at 140 HU's with full mA between 30-70% of the R-R interval . Average HR during the scan was 60 bpm. The 3D data set was interpreted on a dedicated work station using MPR, MIP and VRT modes. A total of 80 cc of contrast was used. FINDINGS: Non-cardiac: See separate report from [facility]. No significant findings on  limited lung and soft tissue windows. Calcium score: Calcium noted in distal RCA and proximal LAD Coronary Arteries: Right dominant with no anomalies LM: Normal LAD: 50-69% proximal calcific lesion at take off of D1 D1: 50-69% ostial lesion involving plaque in proximal LAD D2: Normal Circumflex: Normal OM1: Normal OM2: Normal RCA: Less minimal calcific plaque in distal RCA 1-24% stenosis PDA: Normal PLA: Normal IMPRESSION: 1. Calcium score 23 which is 62 nd percentile for age and sex 2. CAD RADS 3 moderate plaque in proximal LAD involving D1 take off Study sent for FFR CT 3.  Normal aortic root 3.4 cm Charlton HawsPeter Nishan \ Electronically Signed: By: Charlton HawsPeter  Nishan M.D. On: 09/04/2018 14:15   Ct Coronary Fractional Flow Reserve Fluid Analysis  Result Date: 09/05/2018 CLINICAL DATA:  CAD EXAM: FFR CT TECHNIQUE: The best systolic and diastolic phases of the patients gated cardiac CTA sent to HeartFlow for hemodynamic analysis FINDINGS: FFRCT positive in mid LAD and D1 both areas 0.55 IMPRESSION: Positive FFR CT with mid LAD and D1 estimated at 0.55 Patient will be referred for heart catheterization Charlton HawsPeter Nishan Electronically Signed   By: Charlton HawsPeter  Nishan M.D.   On: 09/05/2018 09:34   ECG & Cardiac Imaging    ECG 08/09/2018 Sinus bradycardia, 53 bpm  CTA 09/04/2018: FFRCT positive in mid LAD and D1 both areas 0.55  IMPRESSION: Positive FFR CT with mid LAD and D1 estimated at 0.55 Patient will be referred for heart catheterization  Echocardiogram 08/23/2018:   1. The left ventricle has normal systolic function with an ejection fraction of 60-65%. The cavity size was normal. Left ventricular diastolic Doppler parameters are indeterminate.  2. The right ventricle has normal systolic function. The cavity was normal. There is no increase in right ventricular wall thickness.  3. Left atrial size was severely dilated.  4. Right atrial size was moderately dilated.  5. No evidence of mitral valve stenosis.  6. The  aortic valve is tricuspid.  7. The aortic root is normal in size and structure.  8. Pulmonary hypertension is indeterminate, inadequate TR jet.  Assessment & Plan    1. Arrhythmia:  -She appeared to have a bigeminal rhythm and atrial ectopy as anesthesia was wearing off. She has a history of sleep apnea and is mildly bradycardic.  She is certainly at risk for arrhythmia development albeit she uses CPAP regularly. Given that palpitations are seldom in occurrence, I think an event monitor would be of low yield at this time.  -Echocardiogram with LVEF 60-65% with severe LA dilation, moderate RA dilation.   2. Chest pressure and shortness of breath:  -Appears to occur with more strenuous exertion. There is family history of ischemic heart disease.  -Coronary CT angiography with positive FFR CT with mid LAD and D1 estimated at 0.55  -Cardiac cath scheduled for today for further evaluation   3. OSA:  -Uses CPAP regularly   Signed, Georgie ChardJill McDaniel NP-C [facility] Pager: [phone] 09/14/2018, @NOW  \\  Patient seen and examined with the above-signed Advanced Practice Provider and/or Housestaff. I personally reviewed laboratory data, imaging studies and relevant notes. I independently examined the patient and formulated the important aspects of the plan. I have edited the note to reflect any of my changes or salient points. I have personally discussed the plan with the patient and/or family.  68 IM doc with OSA and bigeminy. Developed some exertional chest tightness on walking hills. CAT coronaries show probable high-grade LAD & D1 lesions with FFR 0.55. Here for cath.   General:  Well appearing. No resp difficulty HEENT: normal Neck: supple. no JVD. Carotids 2+ bilat; no bruits. No lymphadenopathy or thryomegaly appreciated. Cor: PMI nondisplaced. Regular rate & rhythm. No rubs, gallops or murmurs. Lungs: clear Abdomen: soft, nontender, nondistended. No hepatosplenomegaly. No bruits or  masses. Good bowel sounds. Extremities: no cyanosis, clubbing, rash, edema Neuro: alert & orientedx3, cranial nerves grossly intact. moves all 4 extremities w/o difficulty. Affect pleasant  As above plan cath for exertional angina and CAT with high-grade lesions in LAD & D1.  Arvilla Meresaniel Amandine Covino, MD  12:36 PM

## 2018-09-15 ENCOUNTER — Encounter (HOSPITAL_COMMUNITY): Payer: Self-pay | Admitting: Cardiology

## 2018-09-15 DIAGNOSIS — I200 Unstable angina: Secondary | ICD-10-CM

## 2018-09-15 DIAGNOSIS — I25110 Atherosclerotic heart disease of native coronary artery with unstable angina pectoris: Secondary | ICD-10-CM | POA: Diagnosis not present

## 2018-09-15 DIAGNOSIS — R001 Bradycardia, unspecified: Secondary | ICD-10-CM | POA: Diagnosis not present

## 2018-09-15 DIAGNOSIS — R0602 Shortness of breath: Secondary | ICD-10-CM | POA: Diagnosis not present

## 2018-09-15 DIAGNOSIS — G4733 Obstructive sleep apnea (adult) (pediatric): Secondary | ICD-10-CM | POA: Diagnosis not present

## 2018-09-15 LAB — BASIC METABOLIC PANEL
Anion gap: 9 (ref 5–15)
BUN: 17 mg/dL (ref 8–23)
CO2: 22 mmol/L (ref 22–32)
Calcium: 8.8 mg/dL — ABNORMAL LOW (ref 8.9–10.3)
Chloride: 107 mmol/L (ref 98–111)
Creatinine, Ser: 0.67 mg/dL (ref 0.44–1.00)
GFR calc Af Amer: >60 mL/min (ref >60)
GFR calc non Af Amer: >60 mL/min (ref >60)
Glucose, Bld: 89 mg/dL (ref 70–99)
Potassium: 4 mmol/L (ref 3.5–5.1)
Sodium: 138 mmol/L (ref 135–145)

## 2018-09-15 LAB — CBC
HCT: 40.5 % (ref 36.0–46.0)
Hemoglobin: 13.7 g/dL (ref 12.0–15.0)
MCH: 28.3 pg (ref 26.0–34.0)
MCHC: 33.8 g/dL (ref 30.0–36.0)
MCV: 83.7 fL (ref 80.0–100.0)
Platelets: 185 10*3/uL (ref 150–400)
RBC: 4.84 MIL/uL (ref 3.87–5.11)
RDW: 13.1 % (ref 11.5–15.5)
WBC: 5.2 10*3/uL (ref 4.0–10.5)
nRBC: 0 % (ref 0.0–0.2)

## 2018-09-15 MED ORDER — LOSARTAN POTASSIUM 25 MG PO TABS: 25.0000 mg | ORAL_TABLET | Freq: Every day | ORAL | 3 refills | Status: DC

## 2018-09-15 MED ORDER — ASPIRIN 81 MG PO CHEW: 81.0000 mg | CHEWABLE_TABLET | Freq: Every day | ORAL | 3 refills | Status: DC

## 2018-09-15 MED ORDER — CLOPIDOGREL BISULFATE 75 MG PO TABS: 75.0000 mg | ORAL_TABLET | Freq: Every day | ORAL | 3 refills | Status: DC

## 2018-09-15 NOTE — Discharge Summary (Addendum)
Discharge Summary    Patient ID: Alyssa Burns MRN: [id number]; DOB: [birth year]  Admit date: 09/14/2018 Discharge date: 09/15/2018  Primary Care Provider: Terald Sleeper, PA-C  Primary Cardiologist: Alyssa Sable, MD  Primary Electrophysiologist:  None   Discharge Diagnoses    Active Problems:   Unstable angina (HCC)   Allergies Allergies  Allergen Reactions   Morphine And Related Itching   Percocet [Oxycodone-Acetaminophen] Itching    Diagnostic Studies/Procedures    CORONARY BALLOON ANGIOPLASTY  CORONARY STENT INTERVENTION  09/14/2018  Conclusion   Prox LAD to Mid LAD lesion is 90% stenosed.  1st Diag lesion is 99% stenosed.  A drug-eluting stent was successfully placed using a STENT SYNERGY DES 2.25X20.  Post intervention, there is a 0% residual stenosis.  Balloon angioplasty was performed using a BALLOON SAPPHIRE 2.5X12.  Post intervention, there is a 60% residual stenosis.   1. Severe stenosis mid LAD s/p successful PTCA/DES x 1 mid LAD 2. Severe stenosis ostium of the diagonal branch s/p successful PTCA with balloon angioplasty of the Diagonal branch.   Recommend uninterrupted dual antiplatelet therapy with Aspirin 81mg  daily and Clopidogrel 75mg  daily for a minimum of 6 months (stable ischemic heart disease-Class I recommendation).    Diagnostic Dominance: Right  Intervention    _____________   History of Present Illness     Alyssa Burns a 68 y.o.femalewith a history of obstructive sleep apnea who was referred to Alyssa Burns by her PCP, Alyssa Nearing PA, after pr was noted to have bigeminal rhythm and multiple PACs while coming out of anesthesia for ankle surgery.  She was seen on 08/10/2018 by Dr. Bronson Burns and scheduled for a coronary CT for complaints of exertional chest discomfort. CTA performed on 09/04/2018 showed Positive FFR CT with mid LAD and D1 estimated at 0.55 Therefore she was referred for heart catheterization  scheduled for 09/14/2018.   Of note her echocardiogram on 08/23/2018 showed normal LV systolic function but severely dilated left atrium. Dr. Bronson Burns recommends that she is at risk for having atrial fibrillation and if she has palpitations, he would order an event monitor.   Hospital Course     Consultants: None  [name] underwent left heart cath on 09/14/18 that showed a 90% stenosed proximal to mid LAD with successful placement of a STENT SYNERGY DES 2.25X20. There was also 99% stenosed 1st diagonal lesion which was treated with balloon angioplasty. She has been started on aspirin 81 mg and Plavix 75 mg daily to be continued uninterrupted for a minimum of 6 months.   Long discussion about lipid lowering therapy but she adamantly refuses statins, zetia or PCSK-9 therapy for now. Will continue to discuss. BP  elevated yesterday surrounding the cath and this am with SBP routinely above 140. Start losartan 25mg  daily Heart rate in the 60's-70s.   Patient has been seen by Dr. Haroldine Burns today and deemed ready for discharge home. All follow up appointments have been scheduled. Discharge medications are listed below. _____________  Discharge Vitals Blood pressure (!) 149/66, pulse 72, temperature 98 F (36.7 C), temperature source Oral, resp. rate 18, height 5\' 2"  (1.575 m), weight 73.6 kg, SpO2 99 %.  Filed Weights   09/14/18 0936 09/15/18 0449  Weight: 74.8 kg 73.6 kg   General:  Well appearing. No resp difficulty HEENT: normal Neck: supple. no JVD. Carotids 2+ bilat; no bruits. No lymphadenopathy or thryomegaly appreciated. Cor: PMI nondisplaced. Regular rate & rhythm. No rubs, gallops or murmurs. Lungs: clear Abdomen: soft,  nontender, nondistended. No hepatosplenomegaly. No bruits or masses. Good bowel sounds. Extremities: no cyanosis, clubbing, rash, edema right radial cath site ok  Neuro: alert & orientedx3, cranial nerves grossly intact. moves all 4 extremities w/o difficulty.  Affect pleasant   Labs & Radiologic Studies    CBC Recent Labs    09/14/18 0937 09/15/18 0403  WBC 5.5 5.2  HGB 14.2 13.7  HCT 43.0 40.5  MCV 84.8 83.7  PLT 205 185   Basic Metabolic Panel Recent Labs    40/98/1106/19/20 0937 09/15/18 0403  NA 140 138  K 3.9 4.0  CL 105 107  CO2 27 22  GLUCOSE 100* 89  BUN 23 17  CREATININE 0.89 0.67  CALCIUM 9.3 8.8*   Liver Function Tests No results for input(s): AST, ALT, ALKPHOS, BILITOT, PROT, ALBUMIN in the last 72 hours. No results for input(s): LIPASE, AMYLASE in the last 72 hours. Cardiac Enzymes No results for input(s): CKTOTAL, CKMB, CKMBINDEX, TROPONINI in the last 72 hours. BNP Invalid input(s): POCBNP D-Dimer No results for input(s): DDIMER in the last 72 hours. Hemoglobin A1C No results for input(s): HGBA1C in the last 72 hours. Fasting Lipid Panel No results for input(s): CHOL, HDL, LDLCALC, TRIG, CHOLHDL, LDLDIRECT in the last 72 hours. Thyroid Function Tests No results for input(s): TSH, T4TOTAL, T3FREE, THYROIDAB in the last 72 hours.  Invalid input(s): FREET3 _____________  Ct Coronary Morph W/cta Cor W/score W/ca W/cm &/or Wo/cm  Addendum Date: 09/04/2018   ADDENDUM REPORT: 09/04/2018 14:35 EXAM: OVER-READ INTERPRETATION  CT CHEST The following report is an over-read performed by radiologist Dr. Deberah [facility]John [facility] on 09/04/2018. This over-read does not include interpretation of cardiac or coronary anatomy or pathology. The coronary calcium score/coronary CTA interpretation by the cardiologist is attached. COMPARISON:  02/08/2012 FINDINGS: The visualized portions of the lower lung fields show no suspicious nodules, masses, or infiltrates. The visualized portions of the mediastinum show a small hiatal hernia, without significant change since previous study. IMPRESSION: Stable small hiatal hernia. Otherwise no significant non-cardiac abnormality in visualized portion of the thorax. Electronically Signed    By: Alyssa RosenthalJohn  Burns M.D.   On: 09/04/2018 14:35   Result Date: 09/04/2018 CLINICAL DATA:  Chest pain EXAM: Cardiac CTA MEDICATIONS: Sub lingual nitro. 4 mg and lopressor 50mg  PO TECHNIQUE: The patient was scanned on a [employer] Force 192 scanner. Gantry rotation speed was 250 msecs. Collimation was. 6 mm . A 120 kV prospective scan was triggered in the ascending thoracic aorta at 140 HU's with full mA between 30-70% of the R-R interval . Average HR during the scan was 60 bpm. The 3D data set was interpreted on a dedicated work station using MPR, MIP and VRT modes. A total of 80 cc of contrast was used. FINDINGS: Non-cardiac: See separate [facility] from [facility]. No significant findings on limited lung and soft tissue windows. Calcium score: Calcium noted in distal RCA and proximal LAD Coronary Arteries: Right dominant with no anomalies LM: Normal LAD: 50-69% proximal calcific lesion at take off of D1 D1: 50-69% ostial lesion involving plaque in proximal LAD D2: Normal Circumflex: Normal OM1: Normal OM2: Normal RCA: Less minimal calcific plaque in distal RCA 1-24% stenosis PDA: Normal PLA: Normal IMPRESSION: 1. Calcium score 23 which is 62 nd percentile for age and sex 2. CAD RADS 3 moderate plaque in proximal LAD involving D1 take off Study sent for FFR CT 3.  Normal aortic root 3.4 cm Charlton HawsPeter Nishan \ Electronically Signed: By: Theron AristaPeter  [place]den EmmsNishan M.D. On: 09/04/2018 14:15   Ct Coronary Fractional Flow Reserve Fluid Analysis  Result Date: 09/05/2018 CLINICAL DATA:  CAD EXAM: FFR CT TECHNIQUE: The best systolic and diastolic phases of the patients gated cardiac CTA sent to Heart[facility] for hemodynamic analysis FINDINGS: FFRCT positive in mid LAD and D1 both areas 0.55 IMPRESSION: Positive FFR CT with mid LAD and D1 estimated at 0.55 Patient will be referred for heart catheterization Charlton HawsPeter Nishan Electronically Signed   By: Charlton HawsPeter  Nishan M.D.   On: 09/05/2018 09:34   Disposition   Pt is being discharged home today  in good condition.  Follow-up Plans & Appointments    Follow-up Information    Laqueta LindenKoneswaran, Suresh A, MD Follow up.   Specialty: Cardiology Why: Our office will call you to schedule a hospital follow up for about 2 weeks. This may be a virtual visit. The office will discuss with you.  Contact information: [address] [place] [id number] [phone]          Discharge Instructions    Diet - low sodium heart healthy   Complete by: As directed    Discharge instructions   Complete by: As directed    PLEASE REMEMBER TO BRING ALL OF YOUR MEDICATIONS TO EACH OF YOUR FOLLOW-UP OFFICE VISITS.  PLEASE ATTEND ALL SCHEDULED FOLLOW-UP APPOINTMENTS.   Activity: Increase activity slowly as tolerated. You may shower, but no soaking baths (or swimming) for 1 week. No driving for 24 hours. No lifting over 5 lbs for 1 week. No sexual activity for 1 week.   You May Return to Work: in 1 week (if applicable)  Wound Care: You may wash cath site gently with soap and water. Keep cath site clean and dry. If you notice pain, swelling, bleeding or pus at your cath site, please call [phone].   Increase activity slowly   Complete by: As directed       Discharge Medications   Allergies as of 09/15/2018      Reactions   Morphine And Related Itching   Percocet [oxycodone-acetaminophen] Itching      Medication List    TAKE these medications   aspirin 81 MG tablet Take 1 tablet (81 mg total) by mouth 2 (two) times daily.   celecoxib 100 MG capsule Commonly known as: CELEBREX Take 100 mg by mouth 2 (two) times daily as needed for moderate pain.   cholecalciferol 25 MCG (1000 UT) tablet Commonly known as: VITAMIN D3 Take 1,000 Units by mouth daily.   clopidogrel 75 MG tablet Commonly known as: PLAVIX Take 1 tablet (75 mg total) by mouth daily with breakfast.   COQ-10 PO Take 250 mg by mouth daily.   losartan 25 MG tablet Commonly known as: Cozaar Take 1 tablet (25 mg  total) by mouth daily.   magnesium gluconate 500 MG tablet Commonly known as: MAGONATE Take 1,000 mg by mouth at bedtime.   Multivital tablet Take 1 tablet by mouth daily.   vitamin E 200 UNIT capsule Take 200 Units by mouth daily.        Acute coronary syndrome (MI, NSTEMI, STEMI, etc) this admission?: No.    Outstanding Labs/Studies   FLP and LFT in 6-8 weeks.   Duration of Discharge Encounter   Greater than 30 minutes including physician time.  Signed, Berton BonJanine Hammond, NP 09/15/2018, 8:27 AM  Patient seen and examined with the above-signed Advanced Practice Provider and/or Housestaff. I personally reviewed laboratory data, imaging studies and relevant notes.  I independently examined the patient and formulated the important aspects of the plan. I have edited the note to reflect any of my changes or salient points. I have personally discussed the plan with the patient and/or family.  Doing well post LAD and D1 intervention. Cath site looks good. Ok for d/c today. Can f/u with Dr. Darl HouseholderKoneswaren and myself (as desired)   Discussed  lipid lowering therapy but she adamantly refuses statins, zetia or PCSK-9 therapy for now. Will continue to discuss. BP elevated. Start losartan 25mg  daily  Arvilla Meresaniel Ewart Carrera, MD  12:41 PM

## 2018-09-15 NOTE — Progress Notes (Signed)
CARDIAC REHAB PHASE I   PRE:  Rate/Rhythm: Sinus rhythm 83 PVC's  BP:  Supine: 124/95     SaO2: 97%  MODE:  Ambulation: 400 ft   POST:  Rate/Rhythem: Sinus rhythm 80  BP:    Sitting: 143/87     SaO2: 98% RA 0900-0945 ambulated independently in the hallway with right boot intact without complaints or chest pain. Dr Melina Copa was given stent card, heart healthy diet handout. Dr Melina Copa says she has been following a keto diet and has lost about 30 pounds this past year. Activity instructions will be followed per her orthopedist's recommendations. Discussed temperature precautions. When to cal 911. Dr Melina Copa says she is not interested in virtual or phase 2 cardiac rehab at this time.  Harrell Gave RN BSN

## 2018-09-15 NOTE — Progress Notes (Signed)
Pt stable, dc home via wheelchair, accompanied by NT to entrance

## 2018-09-15 NOTE — Discharge Instructions (Signed)
Radial Site Care ° °This sheet gives you information about how to care for yourself after your procedure. Your health care provider may also give you more specific instructions. If you have problems or questions, contact your health care provider. °What can I expect after the procedure? °After the procedure, it is common to have: °· Bruising and tenderness at the catheter insertion area. °Follow these instructions at home: °Medicines °· Take over-the-counter and prescription medicines only as told by your health care provider. °Insertion site care °· Follow instructions from your health care provider about how to take care of your insertion site. Make sure you: °? Wash your hands with soap and water before you change your bandage (dressing). If soap and water are not available, use hand sanitizer. °? Change your dressing as told by your health care provider. °? Leave stitches (sutures), skin glue, or adhesive strips in place. These skin closures may need to stay in place for 2 weeks or longer. If adhesive strip edges start to loosen and curl up, you may trim the loose edges. Do not remove adhesive strips completely unless your health care provider tells you to do that. °· Check your insertion site every day for signs of infection. Check for: °? Redness, swelling, or pain. °? Fluid or blood. °? Pus or a bad smell. °? Warmth. °· Do not take baths, swim, or use a hot tub until your health care provider approves. °· You may shower 24-48 hours after the procedure, or as directed by your health care provider. °? Remove the dressing and gently wash the site with plain soap and water. °? Pat the area dry with a clean towel. °? Do not rub the site. That could cause bleeding. °· Do not apply powder or lotion to the site. °Activity ° °· For 24 hours after the procedure, or as directed by your health care provider: °? Do not flex or bend the affected arm. °? Do not push or pull heavy objects with the affected arm. °? Do not  drive yourself home from the hospital or clinic. You may drive 24 hours after the procedure unless your health care provider tells you not to. °? Do not operate machinery or power tools. °· Do not lift anything that is heavier than 10 lb (4.5 kg), or the limit that you are told, until your health care provider says that it is safe. °· Ask your health care provider when it is okay to: °? Return to work or school. °? Resume usual physical activities or sports. °? Resume sexual activity. °General instructions °· If the catheter site starts to bleed, raise your arm and put firm pressure on the site. If the bleeding does not stop, get help right away. This is a medical emergency. °· If you went home on the same day as your procedure, a responsible adult should be with you for the first 24 hours after you arrive home. °· Keep all follow-up visits as told by your health care provider. This is important. °Contact a health care provider if: °· You have a fever. °· You have redness, swelling, or yellow drainage around your insertion site. °Get help right away if: °· You have unusual pain at the radial site. °· The catheter insertion area swells very fast. °· The insertion area is bleeding, and the bleeding does not stop when you hold steady pressure on the area. °· Your arm or hand becomes pale, cool, tingly, or numb. °These symptoms may represent a serious problem   that is an emergency. Do not wait to see if the symptoms will go away. Get medical help right away. Call your local emergency services (911 in the [place] Do not drive yourself to the hospital. Summary  After the procedure, it is common to have bruising and tenderness at the site.  Follow instructions from your health care provider about how to take care of your radial site wound. Check the wound every day for signs of infection.  Do not lift anything that is heavier than 10 lb (4.5 kg), or the limit that you are told, until your health care provider says  that it is safe. This information is not intended to replace advice given to you by your health care provider. Make sure you discuss any questions you have with your health care provider. Document Released: 04/16/2010 Document Revised: 04/19/2017 Document Reviewed: 04/19/2017   ========================================================================================  Coronary Artery Disease, Female  Coronary artery disease (CAD) is a condition in which the arteries that lead to the heart (coronary arteries) become narrow or blocked. The narrowing or blockage can lead to decreased blood flow to the heart. Prolonged reduced blood flow can cause a heart attack (myocardial infarction or MI). This condition may also be called coronary heart disease. Because CAD is the leading cause of death in women, it is important to understand what causes this condition and how it is treated. What are the causes? CAD is most often caused by atherosclerosis. This is the buildup of fat and cholesterol (plaque) on the inside of the arteries. Over time, the plaque may narrow or block the artery, reducing blood flow to the heart. Plaque can also become weak and break off within a coronary artery and cause a sudden blockage. Other less common causes of CAD include:  An embolism or blood clot in a coronary artery.  A tearing of the artery (spontaneous coronary artery dissection).  An aneurysm.  Inflammation (vasculitis) in the artery wall. What increases the risk? The following factors may make you more likely to develop this condition:  Age. Women over age 67 are at a greater risk of CAD.  Family history of CAD.  High blood pressure (hypertension).  Diabetes.  High cholesterol levels.  Tobacco use.  Lack of exercise.  Menopause. ? All postmenopausal women are at greater risk of CAD. ? Women who have experienced menopause between the ages of 7140-45 (early menopause) are at a higher risk of CAD. ? Women  who have experienced menopause before age 67 (premature menopause) are at a very high risk of CAD.  Excessive alcohol use  A diet high in saturated and trans fats, such as fried food and processed meat. Other possible risk factors include:  High stress levels.  Depression  Obesity.  Sleep apnea. What are the signs or symptoms? Many people do not have any symptoms during the early stages of CAD. As the condition progresses, symptoms may include:  Chest pain (angina). The pain can: ? Feel like crushing or squeezing, or a tightness, pressure, fullness, or heaviness in the chest. ? Last more than a few minutes or can stop and recur. The pain tends to get worse with exercise or stress and to fade with rest.  Pain in the arms, neck, jaw, or back.  Unexplained heartburn or indigestion.  Shortness of breath.  Nausea.  Sudden cold sweats.  Sudden light-headedness.  Fluttering or fast heartbeat (palpitations). Many women have chest discomfort and the other symptoms. However, women often have unusual (atypical) symptoms, such  as:  Fatigue.  Vomiting.  Unexplained feelings of nervousness or anxiety.  Unexplained weakness.  Dizziness or fainting. How is this diagnosed? This condition is diagnosed based on:  Your family and medical history.  A physical exam.  Tests, including: ? A test to check the electrical signals in your heart (electrocardiogram). ? Exercise stress test. This looks for signs of blockage when the heart is stressed with exercise, such as running on a treadmill. ? Pharmacologic stress test. This test looks for signs of blockage when the heart is being stressed with a medicine. ? Blood tests. ? Coronary angiogram. This is a procedure to look at the coronary arteries to see if there is any blockage. During this test, a dye is injected into your arteries so they appear on an X-ray. ? A test that uses sound waves to take a picture of your heart  (echocardiogram). ? Chest X-ray. How is this treated? This condition may be treated by:  Healthy lifestyle changes to reduce risk factors.  Medicines such as: ? Antiplatelet medicines and blood-thinning medicines, such as aspirin. These help prevent blood clots. ? Nitroglycerin. ? Blood pressure medicines. ? Cholesterol-lowering medicine.  Coronary angioplasty and stenting. During this procedure, a thin, flexible tube is inserted through a blood vessel and into a blocked artery. A balloon or similar device on the end of the tube is inflated to open up the artery. In some cases, a small, mesh tube (stent) is inserted into the artery to keep it open.  Coronary artery bypass surgery. During this surgery, veins or arteries from other parts of the body are used to create a bypass around the blockage and allow blood to reach your heart. Follow these instructions at home: Medicines  Take over-the-counter and prescription medicines only as told by your health care provider.  Do not take the following medicines unless your health care provider approves: ? NSAIDs, such as ibuprofen, naproxen, or celecoxib. ? Vitamin supplements that contain vitamin A, vitamin E, or both. ? Hormone replacement therapy that contains estrogen with or without progestin. Lifestyle  Follow an exercise program approved by your health care provider. Aim for 150 minutes of moderate exercise or 75 minutes of vigorous exercise each week.  Maintain a healthy weight or lose weight as approved by your health care provider.  Rest when you are tired.  Learn to manage stress or try to limit your stress. Ask your health care provider for suggestions if you need help.  Get screened for depression and seek treatment, if needed.  Do not use any products that contain nicotine or tobacco, such as cigarettes and e-cigarettes. If you need help quitting, ask your health care provider.  Do not use illegal drugs. Eating and  drinking  Follow a heart-healthy diet. A dietitian can help educate you about healthy food options and changes. In general, eat plenty of fruits and vegetables, lean meats, and whole grains.  Avoid foods high in: ? Sugar. ? Salt (sodium). ? Saturated fats, such as processed or fatty meat. ? Trans fats, such as fried food.  Use healthy cooking methods such as roasting, grilling, broiling, baking, poaching, steaming, or stir-frying.  If you drink alcohol, and your health care provider approves, limit your alcohol intake to no more than 1 drink per day. One drink equals 12 ounces of beer, 5 ounces of wine, or 1 ounces of hard liquor. General instructions  Manage any other health conditions, such as hypertension and diabetes. These conditions affect your heart.  Your health care provider may ask you to monitor your blood pressure. Ideally, your blood pressure should be below 130/80.  Keep all follow-up visits as told by your health care provider. This is important. Get help right away if:  You have pain in your chest, neck, arm, jaw, stomach, or back that: ? Lasts more than a few minutes. ? Is recurring. ? Is not relieved by taking medicine under your tongue (sublingualnitroglycerin).  You have profuse sweating without cause.  You have unexplained: ? Heartburn or indigestion. ? Shortness of breath or difficulty breathing. ? Fluttering or fast heartbeat (palpitations). ? Nausea or vomiting. ? Fatigue. ? Feelings of nervousness or anxiety. ? Weakness. ? Diarrhea.  You have sudden light-headedness or dizziness.  You faint.  You feel like hurting yourself or think about taking your own life. These symptoms may represent a serious problem that is an emergency. Do not wait to see if the symptoms will go away. Get medical help right away. Call your local emergency services (911 in the [place] Do not drive yourself to the hospital. Summary  Coronary artery disease (CAD) is a  process in which the arteries that lead to the heart (coronary arteries) become narrow or blocked. The narrowing or blockage can lead to a heart attack.  Many women have chest discomfort and other common symptoms of CAD. However, women often have different (atypical) symptoms, such as fatigue, vomiting, and dizziness or weakness.  CAD can be treated with lifestyle changes, medicines, surgery, or a combination of these treatments. This information is not intended to replace advice given to you by your health care provider. Make sure you discuss any questions you have with your health care provider. Document Released: 06/06/2011 Document Revised: 03/04/2016 Document Reviewed: 03/04/2016 Elsevier Interactive Patient Education  2019 [employer].  Chartered certified accountant Patient Education  [employer].

## 2018-09-17 ENCOUNTER — Encounter (HOSPITAL_COMMUNITY): Payer: Self-pay | Admitting: Internal Medicine

## 2018-09-24 ENCOUNTER — Other Ambulatory Visit: Payer: Self-pay

## 2018-09-24 ENCOUNTER — Telehealth (INDEPENDENT_AMBULATORY_CARE_PROVIDER_SITE_OTHER): Payer: Medicare Other | Admitting: Cardiovascular Disease

## 2018-09-24 ENCOUNTER — Encounter: Payer: Self-pay | Admitting: Cardiovascular Disease

## 2018-09-24 DIAGNOSIS — I25118 Atherosclerotic heart disease of native coronary artery with other forms of angina pectoris: Secondary | ICD-10-CM | POA: Diagnosis not present

## 2018-09-24 DIAGNOSIS — Z955 Presence of coronary angioplasty implant and graft: Secondary | ICD-10-CM

## 2018-09-24 DIAGNOSIS — I499 Cardiac arrhythmia, unspecified: Secondary | ICD-10-CM

## 2018-09-24 DIAGNOSIS — I10 Essential (primary) hypertension: Secondary | ICD-10-CM

## 2018-09-24 DIAGNOSIS — E785 Hyperlipidemia, unspecified: Secondary | ICD-10-CM

## 2018-09-24 DIAGNOSIS — G4733 Obstructive sleep apnea (adult) (pediatric): Secondary | ICD-10-CM

## 2018-09-24 NOTE — Progress Notes (Signed)
Virtual Visit via Telephone Note   This visit type was conducted due to national recommendations for restrictions regarding the COVID-19 Pandemic (e.g. social distancing) in an effort to limit this patient's exposure and mitigate transmission in our community.  Due to her co-morbid illnesses, this patient is at least at moderate risk for complications without adequate follow up.  This format is felt to be most appropriate for this patient at this time.  The patient did not have access to video technology/had technical difficulties with video requiring transitioning to audio format only (telephone).  All issues noted in this document were discussed and addressed.  No physical exam could be performed with this format.  Please refer to the patient's chart for her  consent to [facility]health [facility].   Date:  09/24/2018   ID:  Alyssa Burns, DOB August 27, 1950, MRN [id number]  Patient Location: Home Provider Location: Office  PCP:  Remus LofflerJones, Angel S, PA-C  Cardiologist:  Prentice DockerSuresh Loman Logan, MD  Electrophysiologist:  None   Evaluation Performed:  Follow-Up Visit  Chief Complaint:  CAD  History of Present Illness:    Alyssa Burns is a 68 y.o. female with recently diagnosed coronary artery disease.  She underwent cardiac catheterization on 09/14/2018 which demonstrated a proximal to mid 90% LAD stenosis.  There was also a 99% stenosis of the first diagonal branch.  She underwent drug-eluting stent placement to the LAD and balloon angioplasty of the diagonal branch.  She denies chest pain, palpitations, shortness of breath.  She walked a mile this morning.  She has decided to retire at the end of July.  She is following a ketogenic diet.  She refuses medications to reduce cholesterol.  The patient does not have symptoms concerning for COVID-19 infection (fever, chills, cough, or new shortness of breath).    Past Medical History:  Diagnosis Date   CAD (coronary artery disease)    a. LHC 09/14/18 90% LAD-DES, 90% OM-PTCA   Sleep apnea    Past Surgical History:  Procedure Laterality Date   CORONARY BALLOON ANGIOPLASTY N/A 09/14/2018   Procedure: CORONARY BALLOON ANGIOPLASTY;  Surgeon: Kathleene HazelMcAlhany, Christopher D, MD;  Location: [facility];  Service: Cardiovascular;  Laterality: N/A;  diag   CORONARY STENT INTERVENTION N/A 09/14/2018   Procedure: CORONARY STENT INTERVENTION;  Surgeon: Kathleene HazelMcAlhany, Christopher D, MD;  Location: [facility];  Service: Cardiovascular;  Laterality: N/A;   JOINT REPLACEMENT Left 01/2002   knee   LEFT HEART CATH AND CORONARY ANGIOGRAPHY N/A 09/14/2018   Procedure: LEFT HEART CATH AND CORONARY ANGIOGRAPHY;  Surgeon: Dolores PattyBensimhon, Daniel R, MD;  Location: [facility];  Service: Cardiovascular;  Laterality: N/A;   ORIF ANKLE FRACTURE Right 08/03/2018   Procedure: OPEN REDUCTION INTERNAL FIXATION (ORIF) BIMALLEOLAR RIGHT ANKLE FRACTURE;  Surgeon: Bjorn PippinVarkey, Dax T, MD;  Location: [facility];  Service: Orthopedics;  Laterality: Right;   TONSILLECTOMY AND ADENOIDECTOMY     TUBAL LIGATION       No outpatient medications have been marked as taking for the 09/24/18 encounter (Appointment) with Laqueta LindenKoneswaran, Milinda Sweeney A, MD.     Allergies:   Morphine and related and Percocet [oxycodone-acetaminophen]   Social History   Tobacco Use   Smoking status: Never Smoker   Smokeless tobacco: Never Used  Substance Use Topics   Alcohol use: No   Drug use: No     Family Hx: The patient's family history includes Arthritis in her father and mother; Cancer in her father;  Diabetes in her father; Hearing loss in her mother; Heart disease in her brother and father.  ROS:   Please see the history of present illness.     All other systems reviewed and are negative.   Prior CV studies:   The following studies were reviewed today:  Echocardiogram 08/23/2018:   1. The left ventricle has normal systolic function with an ejection  fraction of 60-65%. The cavity size was normal. Left ventricular diastolic Doppler parameters are indeterminate.  2. The right ventricle has normal systolic function. The cavity was normal. There is no increase in right ventricular wall thickness.  3. Left atrial size was severely dilated.  4. Right atrial size was moderately dilated.  5. No evidence of mitral valve stenosis.  6. The aortic valve is tricuspid.  7. The aortic root is normal in size and structure.  8. Pulmonary hypertension is indeterminate, inadequate TR jet.   Cardiac catheterization 09/14/2018:   Prox LAD to Mid LAD lesion is 90% stenosed.  1st Diag lesion is 99% stenosed.  A drug-eluting stent was successfully placed using a STENT SYNERGY DES 2.25X20.  Post intervention, there is a 0% residual stenosis.  Balloon angioplasty was performed using a BALLOON SAPPHIRE 2.5X12.  Post intervention, there is a 60% residual stenosis.   1. Severe stenosis mid LAD s/p successful PTCA/DES x 1 mid LAD 2. Severe stenosis ostium of the diagonal branch s/p successful PTCA with balloon angioplasty of the Diagonal branch.   Labs/Other Tests and Data Reviewed:    EKG:  No ECG reviewed.  Recent Labs: 05/11/2018: ALT 20; TSH 2.450 09/15/2018: BUN 17; Creatinine, Ser 0.67; Hemoglobin 13.7; Platelets 185; Potassium 4.0; Sodium 138   Recent Lipid Panel Lab Results  Component Value Date/Time   CHOL 197 06/10/2016 12:16 PM   TRIG 200 (H) 06/10/2016 12:16 PM   HDL 48 06/10/2016 12:16 PM   CHOLHDL 4.1 06/10/2016 12:16 PM   LDLCALC 109 (H) 06/10/2016 12:16 PM    Wt Readings from Last 3 Encounters:  09/15/18 162 lb 4.1 oz (73.6 kg)  08/10/18 175 lb (79.4 kg)  08/03/18 170 lb 6.7 oz (77.3 kg)     Objective:    Vital Signs:  There were no vitals taken for this visit.    ASSESSMENT & PLAN:    1.  Coronary artery disease: Status post drug-eluting stent placement to the mid LAD and balloon angioplasty of the first diagonal  branch on 09/14/2018.  Echocardiogram demonstrated normal left ventricular systolic function on 08/23/2018.  Symptomatically stable.  Continue dual antiplatelet therapy with aspirin and clopidogrel for a minimum of 6 months.  She refuses statins, Zetia, and PCSK9 inhibitor therapy. LDL 137 on 05/11/18.  She is following a ketogenic diet.  2.  Hypertension: She did not check her blood pressure today.  3.  Arrhythmia: She appeared to have a bigeminal rhythm and atrial ectopy when anesthesia was wearing off.  She denies palpitations altogether.  Echocardiogram demonstrated severely dilated left atrium.  Given her other comorbidities including sleep apnea and hypertension, she is at risk for atrial fibrillation.  If she were to ever experience significant palpitations I will obtain an event monitor.  4.  Obstructive sleep apnea: Uses CPAP nightly.  5. Hyperlipidemia: LDL 137 on 05/11/18.  She refuses statins, Zetia, and PCSK9 inhibitor therapy. LDL 137 on 05/11/18.  She is following a ketogenic diet.    COVID-19 Education: The signs and symptoms of COVID-19 were discussed with the patient and how to seek  care for testing (follow up with PCP or arrange E-visit).  The importance of social distancing was discussed today.  Time:   Today, I have spent 15 minutes with the patient with telehealth technology discussing the above problems.     Medication Adjustments/Labs and Tests Ordered: Current medicines are reviewed at length with the patient today.  Concerns regarding medicines are outlined above.   Tests Ordered: No orders of the defined types were placed in this encounter.   Medication Changes: No orders of the defined types were placed in this encounter.   Follow Up:  In Person in 6 month(s)  Signed, Kate Sable, MD  09/24/2018 11:25 AM    [place]

## 2018-09-24 NOTE — Patient Instructions (Signed)
Medication Instructions: Your physician recommends that you continue on your current medications as directed. Please refer to the Current Medication list given to you today.   Labwork: None today  Procedures/Testing: None today  Follow-Up: 6 months with Dr.Koneswaran  Any Additional Special Instructions Will Be Listed Below (If Applicable).     If you need a refill on your cardiac medications before your next appointment, please call your pharmacy.     Thank you for choosing [facility] !        

## 2019-02-19 ENCOUNTER — Other Ambulatory Visit: Payer: Self-pay

## 2019-02-19 DIAGNOSIS — Z20822 Contact with and (suspected) exposure to covid-19: Secondary | ICD-10-CM

## 2019-02-20 LAB — NOVEL CORONAVIRUS, NAA: SARS-CoV-2, NAA: NOT DETECTED

## 2019-05-24 ENCOUNTER — Encounter: Payer: Self-pay | Admitting: Cardiovascular Disease

## 2019-05-24 ENCOUNTER — Telehealth (INDEPENDENT_AMBULATORY_CARE_PROVIDER_SITE_OTHER): Payer: Medicare Other | Admitting: Cardiovascular Disease

## 2019-05-24 VITALS — BP 112/72 | HR 76 | Ht 62.0 in | Wt 164.0 lb

## 2019-05-24 DIAGNOSIS — I25118 Atherosclerotic heart disease of native coronary artery with other forms of angina pectoris: Secondary | ICD-10-CM

## 2019-05-24 DIAGNOSIS — Z9989 Dependence on other enabling machines and devices: Secondary | ICD-10-CM

## 2019-05-24 DIAGNOSIS — G4733 Obstructive sleep apnea (adult) (pediatric): Secondary | ICD-10-CM | POA: Diagnosis not present

## 2019-05-24 DIAGNOSIS — E785 Hyperlipidemia, unspecified: Secondary | ICD-10-CM | POA: Diagnosis not present

## 2019-05-24 DIAGNOSIS — Z955 Presence of coronary angioplasty implant and graft: Secondary | ICD-10-CM

## 2019-05-24 DIAGNOSIS — I10 Essential (primary) hypertension: Secondary | ICD-10-CM | POA: Diagnosis not present

## 2019-05-24 NOTE — Patient Instructions (Signed)

## 2019-05-24 NOTE — Progress Notes (Signed)
Virtual Visit via Telephone Note   This visit type was conducted due to national recommendations for restrictions regarding the COVID-19 Pandemic (e.g. social distancing) in an effort to limit this patient's exposure and mitigate transmission in our community.  Due to her co-morbid illnesses, this patient is at least at moderate risk for complications without adequate follow up.  This format is felt to be most appropriate for this patient at this time.  The patient did not have access to video technology/had technical difficulties with video requiring transitioning to audio format only (telephone).  All issues noted in this document were discussed and addressed.  No physical exam could be performed with this format.  Please refer to the patient's chart for her  consent to telehealth for [facility].   Date:  05/24/2019   ID:  Alyssa Burns, DOB 07-13-1950, MRN [id number]  Patient Location: Home Provider Location: Office  PCP:  Remus Loffler, PA-C  Cardiologist:  Prentice Docker, MD  Electrophysiologist:  None   Evaluation Performed:  Follow-Up Visit  Chief Complaint:  CAD  History of Present Illness:    Alyssa Burns is a 69 y.o. female with coronary artery disease. She underwent drug-eluting stent placement to the proximal LAD and balloon angioplasty of the first diagonal branch on 09/14/2018.  She has been doing very well since then and denies chest pain or shortness of breath. She stopped taking aspirin due to nosebleeds and takes Plavix every other day. She very seldom has some palpitations and lightheadedness.  She has been traveling a lot and has made trips with a friend to [place] and [place] and plans to go to [facility] next week. She also plans to take a trip to [place] to see the [employer].  She has also been knitting.  She is widowed.  Past Medical History:  Diagnosis Date  . CAD (coronary artery disease)    a. LHC 09/14/18 90% LAD-DES, 90% OM-PTCA  .  Sleep apnea    Past Surgical History:  Procedure Laterality Date  . CORONARY BALLOON ANGIOPLASTY N/A 09/14/2018   Procedure: CORONARY BALLOON ANGIOPLASTY;  Surgeon: Kathleene Hazel, MD;  [facility];  Service: Cardiovascular;  Laterality: N/A;  diag  . CORONARY STENT INTERVENTION N/A 09/14/2018   Procedure: CORONARY STENT INTERVENTION;  Surgeon: Kathleene Hazel, MD;  [facility];  Service: Cardiovascular;  Laterality: N/A;  . JOINT REPLACEMENT Left 01/2002   knee  . LEFT HEART CATH AND CORONARY ANGIOGRAPHY N/A 09/14/2018   Procedure: LEFT HEART CATH AND CORONARY ANGIOGRAPHY;  Surgeon: Dolores Patty, MD;  [facility];  Service: Cardiovascular;  Laterality: N/A;  . ORIF ANKLE FRACTURE Right 08/03/2018   Procedure: OPEN REDUCTION INTERNAL FIXATION (ORIF) BIMALLEOLAR RIGHT ANKLE FRACTURE;  Surgeon: Bjorn Pippin, MD;  Location: [facility];  Service: Orthopedics;  Laterality: Right;  . TONSILLECTOMY AND ADENOIDECTOMY    . TUBAL LIGATION       Current Meds  Medication Sig  . celecoxib (CELEBREX) 100 MG capsule Take 100 mg by mouth 2 (two) times daily as needed for moderate pain.  . cholecalciferol (VITAMIN D3) 25 MCG (1000 UT) tablet Take 1,000 Units by mouth daily.  . clopidogrel (PLAVIX) 75 MG tablet Take 75 mg by mouth every other day.  . Coenzyme Q10 (COQ-10 PO) Take 250 mg by mouth daily.  Marland Kitchen losartan (COZAAR) 25 MG tablet Take 1 tablet (25 mg total) by mouth daily.  Marland Kitchen  magnesium gluconate (MAGONATE) 500 MG tablet Take 1,000 mg by mouth at bedtime.  . Multiple Vitamins-Minerals (MULTIVITAL) tablet Take 1 tablet by mouth daily.  . vitamin E 200 UNIT capsule Take 200 Units by mouth daily.     Allergies:   Morphine and related and Percocet [oxycodone-acetaminophen]   Social History   Tobacco Use  . Smoking status: Never Smoker  . Smokeless tobacco: Never Used  Substance Use Topics  . Alcohol use: No  .  Drug use: No     Family Hx: The patient's family history includes Arthritis in her father and mother; Cancer in her father; Diabetes in her father; Hearing loss in her mother; Heart disease in her brother and father.  ROS:   Please see the history of present illness.     All other systems reviewed and are negative.   Prior CV studies:   The following studies were reviewed today:  Echocardiogram 08/23/2018:  1. The left ventricle has normal systolic function with an ejection fraction of 60-65%. The cavity size was normal. Left ventricular diastolic Doppler parameters are indeterminate. 2. The right ventricle has normal systolic function. The cavity was normal. There is no increase in right ventricular wall thickness. 3. Left atrial size was severely dilated. 4. Right atrial size was moderately dilated. 5. No evidence of mitral valve stenosis. 6. The aortic valve is tricuspid. 7. The aortic root is normal in size and structure. 8. Pulmonary hypertension is indeterminate, inadequate TR jet.   Cardiac catheterization 09/14/2018:   Prox LAD to Mid LAD lesion is 90% stenosed.  1st Diag lesion is 99% stenosed.  A drug-eluting stent was successfully placed using a STENT SYNERGY DES 2.25X20.  Post intervention, there is a 0% residual stenosis.  Balloon angioplasty was performed using a BALLOON SAPPHIRE 2.5X12.  Post intervention, there is a 60% residual stenosis.  1. Severe stenosis mid LAD s/p successful PTCA/DES x 1 mid LAD 2. Severe stenosis ostium of the diagonal branch s/p successful PTCA with balloon angioplasty of the Diagonal branch.   Labs/Other Tests and Data Reviewed:    EKG:  No ECG reviewed.  Recent Labs: 09/15/2018: BUN 17; Creatinine, Ser 0.67; Hemoglobin 13.7; Platelets 185; Potassium 4.0; Sodium 138   Recent Lipid Panel Lab Results  Component Value Date/Time   CHOL 197 06/10/2016 12:16 PM   TRIG 200 (H) 06/10/2016 12:16 PM   HDL 48 06/10/2016  12:16 PM   CHOLHDL 4.1 06/10/2016 12:16 PM   LDLCALC 109 (H) 06/10/2016 12:16 PM    Wt Readings from Last 3 Encounters:  05/24/19 164 lb (74.4 kg)  09/15/18 162 lb 4.1 oz (73.6 kg)  08/10/18 175 lb (79.4 kg)     Objective:    Vital Signs:  BP 112/72   Pulse 76   Ht 5\' 2"  (1.575 m)   Wt 164 lb (74.4 kg)   BMI 30.00 kg/m    VITAL SIGNS:  reviewed  ASSESSMENT & PLAN:    1.  Coronary artery disease: Status post drug-eluting stent placement to the mid LAD and balloon angioplasty of the first diagonal branch on 09/14/2018.  Echocardiogram demonstrated normal left ventricular systolic function on 08/23/2018.  Symptomatically stable.   Due to nosebleeds, she stopped taking aspirin and takes Plavix every other day. I told her there when it has been 1 year since stent placement, she can stop Plavix and start taking 81 mg of aspirin daily.   We previously had a discussion and at that time she refused statins,  Zetia, and PCSK9 inhibitor therapy. She continues to follow a ketogenic diet.  2.  Hypertension: Blood pressure is normal. No changes.  3.  Obstructive sleep apnea: Uses CPAP nightly.  4. Hyperlipidemia: We previously had a discussion and at that time she refused statins, Zetia, and PCSK9 inhibitor therapy. She continues to follow a ketogenic diet.     COVID-19 Education: The signs and symptoms of COVID-19 were discussed with the patient and how to seek care for testing (follow up with PCP or arrange E-visit).  The importance of social distancing was discussed today.  Time:   Today, I have spent 10 minutes with the patient with telehealth technology discussing the above problems.     Medication Adjustments/Labs and Tests Ordered: Current medicines are reviewed at length with the patient today.  Concerns regarding medicines are outlined above.   Tests Ordered: No orders of the defined types were placed in this encounter.   Medication Changes: No orders of the defined types  were placed in this encounter.   Follow Up:  In Person in 1 year(s)  Signed, Kate Sable, MD  05/24/2019 9:10 AM    [place]

## 2019-07-05 DIAGNOSIS — I10 Essential (primary) hypertension: Secondary | ICD-10-CM | POA: Diagnosis not present

## 2019-07-05 DIAGNOSIS — I2510 Atherosclerotic heart disease of native coronary artery without angina pectoris: Secondary | ICD-10-CM | POA: Diagnosis not present

## 2020-04-16 IMAGING — CT CT HEAR MORPH WITH CTA COR WITH SCORE WITH CA WITH CONTRAST AND
4 of 7 series · 8 of 20 positions shown, 9 images · IV contrast (APPLIED)
Comparison: 02/08/2012

Addendum:
CLINICAL DATA: Chest pain

EXAM:
Cardiac CTA
MEDICATIONS:
Sub lingual nitro. 4 mg and lopressor 50mg PO
TECHNIQUE: The patient was scanned on a Siemens Force 192 scanner. Gantry
rotation speed was 250 msecs. Collimation was. 6 mm . A 120 kV
prospective scan was triggered in the ascending thoracic aorta at
140 HU's with full mA between 30-70% of the R-R interval . Average
HR during the scan was 60 bpm. The 3D data set was interpreted on a
dedicated work station using MPR, MIP and VRT modes. A total of 80
cc of contrast was used.

[Series 6: best diast 74 % · axial · 0.37mm/px · z∈[+1119,+1158]mm · 2 of 291 slices shown, 3 images]
[im 97/291  vessel]
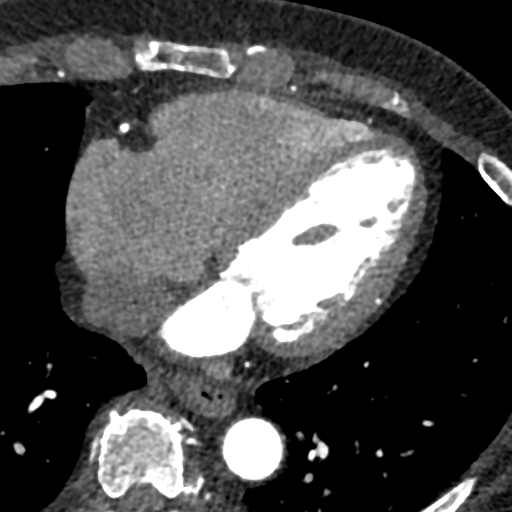
[im 97/291  lung]
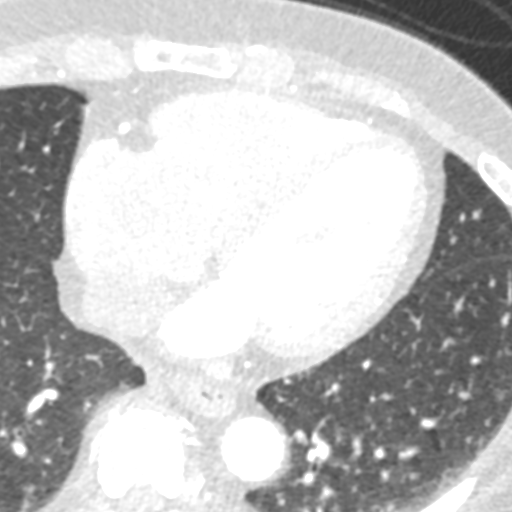
[im 194/291  vessel]
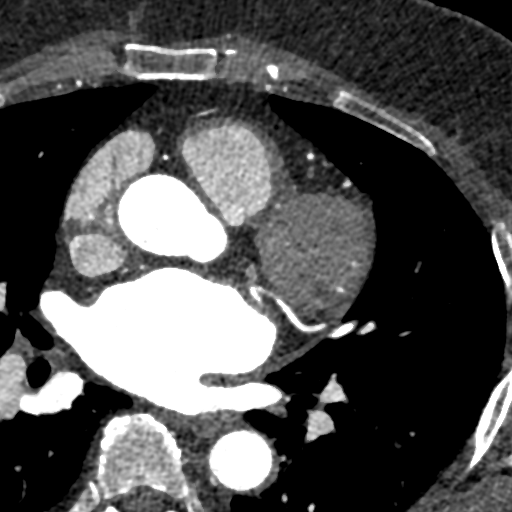

[Series 7: best syst 35 % · axial · 0.37mm/px · z∈[+1119,+1158]mm · 2 of 291 slices shown]
[im 97/291  vessel]
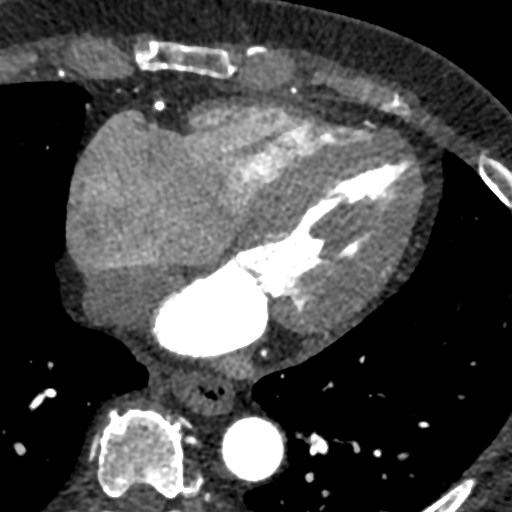
[im 194/291  vessel]
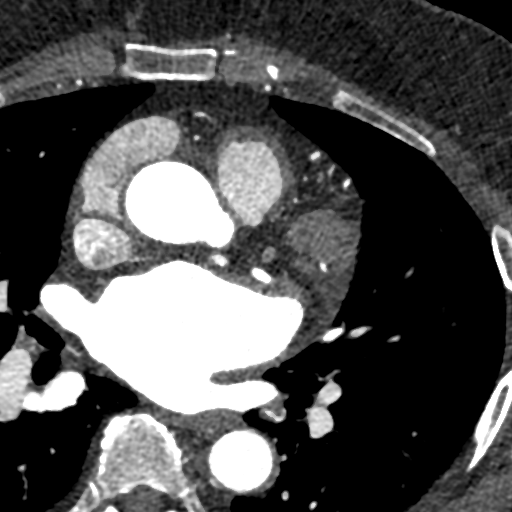

[Series 8: ts diast sharp 74 % · axial · 0.37mm/px · z∈[+1119,+1158]mm · 2 of 291 slices shown]
[im 97/291  lung]
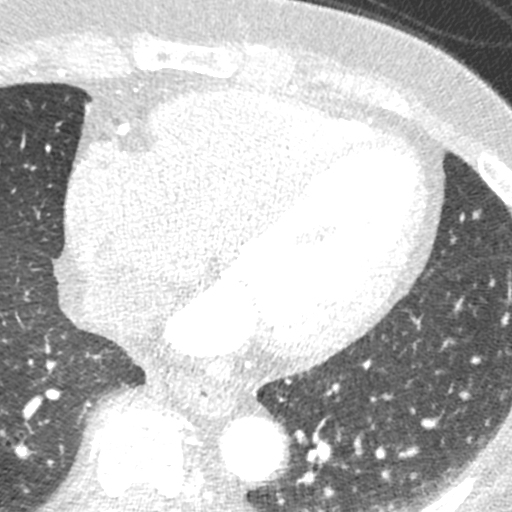
[im 194/291  lung]
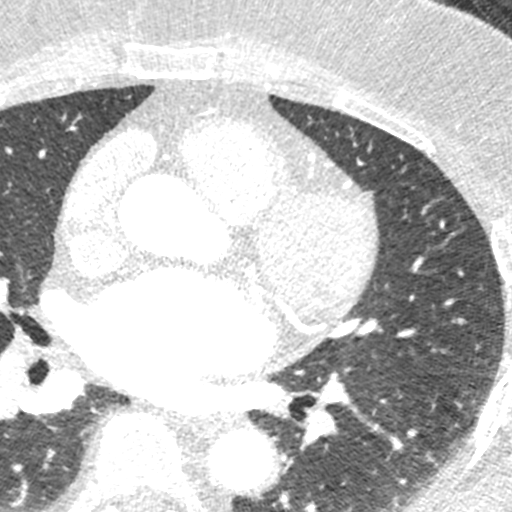

[Series 9: ts syst sharp 35 % · axial · 0.37mm/px · z∈[+1119,+1158]mm · 2 of 291 slices shown]
[im 97/291  lung]
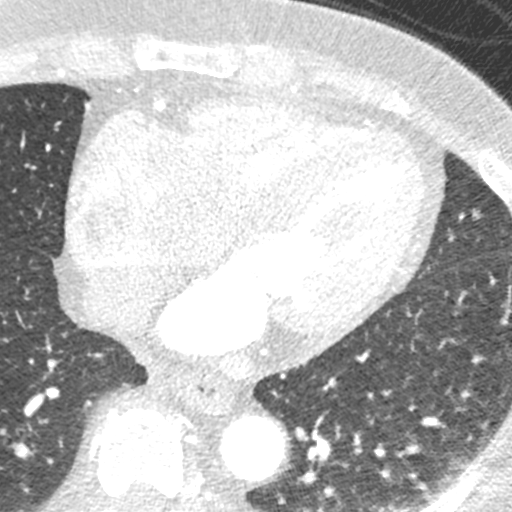
[im 194/291  lung]
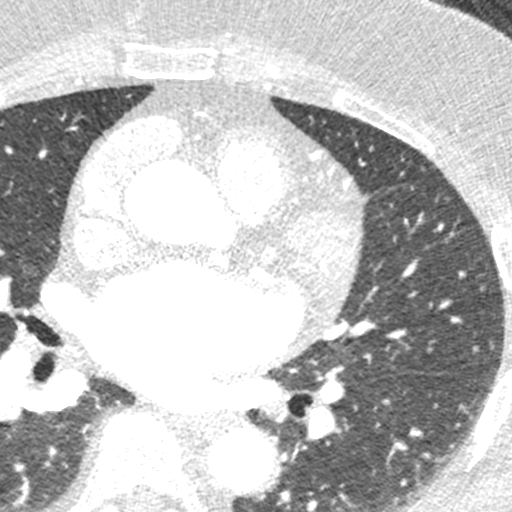

[8 of 20 positions shown; findings below may reference images not displayed]

FINDINGS: Non-cardiac: See separate report from [REDACTED]. No
significant findings on limited lung and soft tissue windows.

Calcium score: Calcium noted in distal RCA and proximal LAD

Coronary Arteries: Right dominant with no anomalies

LM: Normal

LAD: 50-69% proximal calcific lesion at take off of D1

D1: 50-69% ostial lesion involving plaque in proximal LAD

D2: Normal

Circumflex: Normal

OM1: Normal

OM2: Normal

RCA: Less minimal calcific plaque in distal RCA 1-24% stenosis

PDA: Normal

PLA: Normal
IMPRESSION: 1. Calcium score 23 which is 62 nd percentile for age and sex

2. CAD RADS 3 moderate plaque in proximal LAD involving D1 take off
Study sent for FFR CT

3.  Normal aortic root 3.4 cm

Jan Douwe Theijssen

EXAM:
OVER-READ INTERPRETATION  CT CHEST

The following report is an over-read performed by radiologist Dr.
does not include interpretation of cardiac or coronary anatomy or
pathology. The coronary calcium score/coronary CTA interpretation by
the cardiologist is attached.
FINDINGS: The visualized portions of the lower lung fields show no suspicious
nodules, masses, or infiltrates. The visualized portions of the
mediastinum show a small hiatal hernia, without significant change
since previous study.
IMPRESSION: Stable small hiatal hernia. Otherwise no significant non-cardiac
abnormality in visualized portion of the thorax.

*** End of Addendum ***
FINDINGS: Non-cardiac: See separate report from [REDACTED]. No
significant findings on limited lung and soft tissue windows.

Calcium score: Calcium noted in distal RCA and proximal LAD

Coronary Arteries: Right dominant with no anomalies

LM: Normal

LAD: 50-69% proximal calcific lesion at take off of D1

D1: 50-69% ostial lesion involving plaque in proximal LAD

D2: Normal

Circumflex: Normal

OM1: Normal

OM2: Normal

RCA: Less minimal calcific plaque in distal RCA 1-24% stenosis

PDA: Normal

PLA: Normal
IMPRESSION: 1. Calcium score 23 which is 62 nd percentile for age and sex

2. CAD RADS 3 moderate plaque in proximal LAD involving D1 take off
Study sent for FFR CT

3.  Normal aortic root 3.4 cm

Jan Douwe Theijssen

## 2020-06-23 DIAGNOSIS — Z9989 Dependence on other enabling machines and devices: Secondary | ICD-10-CM | POA: Insufficient documentation

## 2020-06-23 DIAGNOSIS — G4733 Obstructive sleep apnea (adult) (pediatric): Secondary | ICD-10-CM | POA: Insufficient documentation

## 2020-06-23 DIAGNOSIS — I2510 Atherosclerotic heart disease of native coronary artery without angina pectoris: Secondary | ICD-10-CM | POA: Insufficient documentation

## 2020-06-23 DIAGNOSIS — E785 Hyperlipidemia, unspecified: Secondary | ICD-10-CM | POA: Insufficient documentation

## 2020-06-23 DIAGNOSIS — I10 Essential (primary) hypertension: Secondary | ICD-10-CM | POA: Insufficient documentation

## 2020-06-23 NOTE — Progress Notes (Signed)
Cardiology Office Note   Date:  06/24/2020   ID:  Alyssa Burns, Alyssa Burns 12-Nov-1950, MRN [id number]  PCP:  Remus Loffler, PA-C  Cardiologist:   Rollene Rotunda, MD   Chief Complaint  Patient presents with  . Coronary Artery Disease      History of Present Illness: Alyssa Burns is a 70 y.o. female who presents for follow up of CAD.  She has seen Dr. Purvis Sheffield.  She had a DES to the LAD in 2020.  She has done well since that event.  She never really had chest pain.  She had some ectopy. The patient denies any new symptoms such as chest discomfort, neck or arm discomfort. There has been no new shortness of breath, PND or orthopnea. There have been no reported palpitations, presyncope or syncope.  She does a lot of walking for exercise.   Past Medical History:  Diagnosis Date  . CAD (coronary artery disease)    a. LHC 09/14/18 90% LAD-DES, 90% OM-PTCA  . Sleep apnea     Past Surgical History:  Procedure Laterality Date  . CORONARY BALLOON ANGIOPLASTY N/A 09/14/2018   Procedure: CORONARY BALLOON ANGIOPLASTY;  Surgeon: Kathleene Hazel, MD;  [facility];  Service: Cardiovascular;  Laterality: N/A;  diag  . CORONARY STENT INTERVENTION N/A 09/14/2018   Procedure: CORONARY STENT INTERVENTION;  Surgeon: Kathleene Hazel, MD;  [facility];  Service: Cardiovascular;  Laterality: N/A;  . JOINT REPLACEMENT Left 01/2002   knee  . LEFT HEART CATH AND CORONARY ANGIOGRAPHY N/A 09/14/2018   Procedure: LEFT HEART CATH AND CORONARY ANGIOGRAPHY;  Surgeon: Dolores Patty, MD;  [facility];  Service: Cardiovascular;  Laterality: N/A;  . ORIF ANKLE FRACTURE Right 08/03/2018   Procedure: OPEN REDUCTION INTERNAL FIXATION (ORIF) BIMALLEOLAR RIGHT ANKLE FRACTURE;  Surgeon: Bjorn Pippin, MD;  Location: [facility];  Service: Orthopedics;  Laterality: Right;  . TONSILLECTOMY AND ADENOIDECTOMY    . TUBAL LIGATION        Current Outpatient Medications  Medication Sig Dispense Refill  . aspirin 81 MG chewable tablet Chew 81 mg by mouth daily.    . cholecalciferol (VITAMIN D3) 25 MCG (1000 UT) tablet Take 1,000 Units by mouth daily.    . Coenzyme Q10 (COQ-10 PO) Take 250 mg by mouth daily.    Marland Kitchen losartan (COZAAR) 25 MG tablet Take 1 tablet (25 mg total) by mouth daily. 90 tablet 3  . magnesium gluconate (MAGONATE) 500 MG tablet Take 1,000 mg by mouth at bedtime.     No current facility-administered medications for this visit.    Allergies:   Morphine and related and Percocet [oxycodone-acetaminophen]    ROS:  Please see the history of present illness.   Otherwise, review of systems are positive for none.   All other systems are reviewed and negative.    PHYSICAL EXAM: VS:  BP 122/80   Pulse 72   Ht 5\' 1"  (1.549 m)   Wt 172 lb (78 kg)   BMI 32.50 kg/m  , BMI Body mass index is 32.5 kg/m. GENERAL:  Well appearing NECK:  No jugular venous distention, waveform within normal limits, carotid upstroke brisk and symmetric, no bruits, no thyromegaly LUNGS:  Clear to auscultation bilaterally CHEST:  Unremarkable HEART:  PMI not displaced or sustained,S1 and S2 within normal limits, no S3, no S4, no clicks, no rubs, no murmurs ABD:  Flat, positive bowel sounds normal  in frequency in pitch, no bruits, no rebound, no guarding, no midline pulsatile mass, no hepatomegaly, no splenomegaly EXT:  2 plus pulses throughout, no edema, no cyanosis no clubbing   EKG:  EKG is ordered today. The ekg ordered today demonstrates sinus rhythm, rate 72, axis within normal limits, intervals within normal limits, no acute ST-T wave changes.   Recent Labs: No results found for requested labs within last 8760 hours.    Lipid Panel    Component Value Date/Time   CHOL 197 06/10/2016 1216   TRIG 200 (H) 06/10/2016 1216   HDL 48 06/10/2016 1216   CHOLHDL 4.1 06/10/2016 1216   LDLCALC 109 (H) 06/10/2016 1216       Wt Readings from Last 3 Encounters:  06/24/20 172 lb (78 kg)  05/24/19 164 lb (74.4 kg)  09/15/18 162 lb 4.1 oz (73.6 kg)      Other studies Reviewed: Additional studies/ records that were reviewed today include: Labs. Review of the above records demonstrates:  Please see elsewhere in the note.     ASSESSMENT AND PLAN:  Coronary artery disease:     He has had no new symptoms.  No change in therapy.  Hypertension:  At target.  No change in therapy.   Obstructive sleep apnea: She is on CPAP.  Continue current therapy.  Hyperlipidemia:     We talked at length.  Her LDL was 161.  She does not want take a statin.  She does not want to consider PCSK9.  We talked about the benefits of.  I talked about plant-based Mediterranean diet.  Take all of this under consideration.     Current medicines are reviewed at length with the patient today.  The patient does not have concerns regarding medicines.  The following changes have been made:  no change  Labs/ tests ordered today include: None  Orders Placed This Encounter  Procedures  . EKG 12-Lead     Disposition:   FU with in one year.     Signed, Rollene Rotunda, MD  06/24/2020 12:36 PM    [facility]

## 2020-06-24 ENCOUNTER — Other Ambulatory Visit: Payer: Self-pay

## 2020-06-24 ENCOUNTER — Ambulatory Visit: Payer: Medicare Other | Admitting: Cardiology

## 2020-06-24 ENCOUNTER — Encounter: Payer: Self-pay | Admitting: Cardiology

## 2020-06-24 VITALS — BP 122/80 | HR 72 | Ht 61.0 in | Wt 172.0 lb

## 2020-06-24 DIAGNOSIS — G4733 Obstructive sleep apnea (adult) (pediatric): Secondary | ICD-10-CM

## 2020-06-24 DIAGNOSIS — Z9989 Dependence on other enabling machines and devices: Secondary | ICD-10-CM

## 2020-06-24 DIAGNOSIS — I10 Essential (primary) hypertension: Secondary | ICD-10-CM | POA: Diagnosis not present

## 2020-06-24 DIAGNOSIS — E785 Hyperlipidemia, unspecified: Secondary | ICD-10-CM | POA: Diagnosis not present

## 2020-06-24 DIAGNOSIS — I25118 Atherosclerotic heart disease of native coronary artery with other forms of angina pectoris: Secondary | ICD-10-CM

## 2020-06-24 NOTE — Patient Instructions (Signed)
Medication Instructions:  The current medical regimen is effective;  continue present plan and medications.  *If you need a refill on your cardiac medications before your next appointment, please call your pharmacy*  Follow-Up: At [facility], you and your health needs are our priority.  As part of our continuing mission to provide you with exceptional heart care, we have created designated Provider Care Teams.  These Care Teams include your primary Cardiologist (physician) and Advanced Practice Providers (APPs -  Physician Assistants and Nurse Practitioners) who all work together to provide you with the care you need, when you need it.  We recommend signing up for the patient portal called "MyChart".  Sign up information is provided on this After Visit Summary.  MyChart is used to connect with patients for Virtual Visits (Telemedicine).  Patients are able to view lab/test results, encounter notes, upcoming appointments, etc.  Non-urgent messages can be sent to your provider as well.   To learn more about what you can do with MyChart, go to [email or web address].    Your next appointment:   12 month(s)  The format for your next appointment:   In Person  Provider:   James Hochrein, MD   Thank you for choosing [facility]!!     

## 2021-09-16 ENCOUNTER — Telehealth: Payer: Self-pay

## 2021-09-16 NOTE — Telephone Encounter (Signed)
Pt has an upcoming annual visit with Dr. Antoine Poche. Pt has not been seen in over 1 year. Will defer to Dr Antoine Poche for decisions regarding surgery.   I will remove from the preop box. Dr. Antoine Poche will communicate with the requesting office directly.

## 2021-09-21 DIAGNOSIS — Z01810 Encounter for preprocedural cardiovascular examination: Secondary | ICD-10-CM | POA: Insufficient documentation

## 2021-09-21 NOTE — Progress Notes (Signed)
Cardiology Office Note   Date:  09/22/2021   ID:  Alyssa, Burns 06-16-1950, MRN [id number]  PCP:  Remus Loffler, PA-C  Cardiologist:   Rollene Rotunda, MD   Chief Complaint  Patient presents with   Knee Pain      History of Present Illness: Alyssa Burns is a 71 y.o. female who presents for follow up of CAD.  She had a DES to the LAD in 2020.   Since I last saw her she has done well.  The patient denies any new symptoms such as chest discomfort, neck or arm discomfort. There has been no new shortness of breath, PND or orthopnea. There have been no reported palpitations, presyncope or syncope.   She needs knee replacement.  Walking a couple miles a day for exercise.  She is active around her home.  Past Medical History:  Diagnosis Date   CAD (coronary artery disease)    a. LHC 09/14/18 90% LAD-DES, 90% OM-PTCA   Sleep apnea     Past Surgical History:  Procedure Laterality Date   CORONARY BALLOON ANGIOPLASTY N/A 09/14/2018   Procedure: CORONARY BALLOON ANGIOPLASTY;  Surgeon: Kathleene Hazel, MD;  [facility];  Service: Cardiovascular;  Laterality: N/A;  diag   CORONARY STENT INTERVENTION N/A 09/14/2018   Procedure: CORONARY STENT INTERVENTION;  Surgeon: Kathleene Hazel, MD;  [facility];  Service: Cardiovascular;  Laterality: N/A;   JOINT REPLACEMENT Left 01/2002   knee   LEFT HEART CATH AND CORONARY ANGIOGRAPHY N/A 09/14/2018   Procedure: LEFT HEART CATH AND CORONARY ANGIOGRAPHY;  Surgeon: Dolores Patty, MD;  [facility];  Service: Cardiovascular;  Laterality: N/A;   ORIF ANKLE FRACTURE Right 08/03/2018   Procedure: OPEN REDUCTION INTERNAL FIXATION (ORIF) BIMALLEOLAR RIGHT ANKLE FRACTURE;  Surgeon: Bjorn Pippin, MD;  Location: [facility];  Service: Orthopedics;  Laterality: Right;   TONSILLECTOMY AND ADENOIDECTOMY     TUBAL LIGATION       Current Outpatient Medications   Medication Sig Dispense Refill   cholecalciferol (VITAMIN D3) 25 MCG (1000 UT) tablet Take 1,000 Units by mouth daily.     magnesium gluconate (MAGONATE) 500 MG tablet Take 1,000 mg by mouth at bedtime.     No current facility-administered medications for this visit.    Allergies:   Morphine and related and Percocet [oxycodone-acetaminophen]    ROS:  Please see the history of present illness.   Otherwise, review of systems are positive for none.   All other systems are reviewed and negative.    PHYSICAL EXAM: VS:  BP 134/83   Pulse 89   Ht 5\' 1"  (1.549 m)   Wt 173 lb (78.5 kg)   SpO2 96%   BMI 32.69 kg/m  , BMI Body mass index is 32.69 kg/m. GENERAL:  Well appearing NECK:  No jugular venous distention, waveform within normal limits, carotid upstroke brisk and symmetric, no bruits, no thyromegaly LUNGS:  Clear to auscultation bilaterally CHEST:  Unremarkable HEART:  PMI not displaced or sustained,S1 and S2 within normal limits, no S3, no S4, no clicks, no rubs, very soft brief right upper sternal border systolic murmur, no diastolic murmurs ABD:  Flat, positive bowel sounds normal in frequency in pitch, no bruits, no rebound, no guarding, no midline pulsatile mass, no hepatomegaly, no splenomegaly EXT:  2 plus pulses throughout, no edema, no cyanosis no clubbing   EKG:  EKG is  ordered today. The ekg ordered today demonstrates sinus rhythm, rate 87, axis within normal limits, intervals within normal limits, no acute ST-T wave changes.   Recent Labs: No results found for requested labs within last 365 days.    Lipid Panel    Component Value Date/Time   CHOL 197 06/10/2016 1216   TRIG 200 (H) 06/10/2016 1216   HDL 48 06/10/2016 1216   CHOLHDL 4.1 06/10/2016 1216   LDLCALC 109 (H) 06/10/2016 1216      Wt Readings from Last 3 Encounters:  09/22/21 173 lb (78.5 kg)  06/24/20 172 lb (78 kg)  05/24/19 164 lb (74.4 kg)      Other studies Reviewed: Additional  studies/ records that were reviewed today include: Labs. Review of the above records demonstrates:  Please see elsewhere in the note.     ASSESSMENT AND PLAN:  Coronary artery disease:    The patient has no new sypmtoms.  No further cardiovascular testing is indicated.  We will continue with aggressive risk reduction and meds as listed.  I have suggested restarting ASA 81 mg.  He does have residual ostial diagonal disease which we are managing medically.  We reviewed these films today.   Hypertension:   The BP was at target.  No change in therapy.    Obstructive sleep apnea: She is on CPAP.    Hyperlipidemia:     She has not wanted to take a statin.  She has not wanted to PCSK9 inhibitor.    Preop: The patient has no high risk findings.  She is at very high functional level.  She is not going for high risk procedure.  Therefore, based on ACC/AHA guidelines no further testing is indicated.  Current medicines are reviewed at length with the patient today.  The patient does not have concerns regarding medicines.  The following changes have been made: As above  Labs/ tests ordered today include: None  Orders Placed This Encounter  Procedures   EKG 12-Lead     Disposition:   FU with in 18 months  Signed, Rollene Rotunda, MD  09/22/2021 11:36 AM    [facility]

## 2021-09-22 ENCOUNTER — Encounter: Payer: Self-pay | Admitting: Cardiology

## 2021-09-22 ENCOUNTER — Ambulatory Visit: Payer: Medicare Other | Admitting: Cardiology

## 2021-09-22 VITALS — BP 134/83 | HR 89 | Ht 61.0 in | Wt 173.0 lb

## 2021-09-22 DIAGNOSIS — Z01810 Encounter for preprocedural cardiovascular examination: Secondary | ICD-10-CM

## 2021-09-22 DIAGNOSIS — E785 Hyperlipidemia, unspecified: Secondary | ICD-10-CM

## 2021-09-22 DIAGNOSIS — Z9989 Dependence on other enabling machines and devices: Secondary | ICD-10-CM

## 2021-09-22 DIAGNOSIS — I10 Essential (primary) hypertension: Secondary | ICD-10-CM | POA: Diagnosis not present

## 2021-09-22 DIAGNOSIS — I25118 Atherosclerotic heart disease of native coronary artery with other forms of angina pectoris: Secondary | ICD-10-CM | POA: Diagnosis not present

## 2021-09-22 DIAGNOSIS — G4733 Obstructive sleep apnea (adult) (pediatric): Secondary | ICD-10-CM

## 2021-09-22 NOTE — Patient Instructions (Signed)
Medication Instructions:  The current medical regimen is effective;  continue present plan and medications.  *If you need a refill on your cardiac medications before your next appointment, please call your pharmacy*  Follow-Up: At [facility], you and your health needs are our priority.  As part of our continuing mission to provide you with exceptional heart care, we have created designated Provider Care Teams.  These Care Teams include your primary Cardiologist (physician) and Advanced Practice Providers (APPs -  Physician Assistants and Nurse Practitioners) who all work together to provide you with the care you need, when you need it.  We recommend signing up for the patient portal called "MyChart".  Sign up information is provided on this After Visit Summary.  MyChart is used to connect with patients for Virtual Visits (Telemedicine).  Patients are able to view lab/test results, encounter notes, upcoming appointments, etc.  Non-urgent messages can be sent to your provider as well.   To learn more about what you can do with MyChart, go to [email or web address].    Your next appointment:   18  month(s)  The format for your next appointment:   In Person  Provider:   Rollene Rotunda, MD{  portant Information About Sugar

## 2021-10-20 NOTE — H&P (Signed)
KNEE ARTHROPLASTY ADMISSION H&P  Patient ID: Alyssa Burns MRN: [id number] DOB/AGE: 08-19-1950 71 y.o.  Chief Complaint: right knee pain.  Planned Procedure Date: 11/09/21 Medical Clearance by Lowry Ram NP   Cardiac Clearance by Dr. Rollene Rotunda   HPI: Alyssa Burns is a 71 y.o. female who presents for evaluation of OA RIGHT KNEE. The patient has a history of pain and functional disability in the right knee due to arthritis and has failed non-surgical conservative treatments for greater than 12 weeks to include NSAID's and/or analgesics, corticosteriod injections, viscosupplementation injections, and activity modification.  Onset of symptoms was gradual, starting 3 years ago with gradually worsening course since that time. The patient noted no past surgery on the right knee.  Patient currently rates pain at 3 out of 10 with activity. Patient has night pain, worsening of pain with activity and weight bearing, and pain that interferes with activities of daily living.  Patient has evidence of subchondral sclerosis, periarticular osteophytes, and joint space narrowing by imaging studies.  There is no active infection.  Past Medical History:  Diagnosis Date   CAD (coronary artery disease)    a. LHC 09/14/18 90% LAD-DES, 90% OM-PTCA   Sleep apnea    Past Surgical History:  Procedure Laterality Date   CORONARY BALLOON ANGIOPLASTY N/A 09/14/2018   Procedure: CORONARY BALLOON ANGIOPLASTY;  Surgeon: Kathleene Hazel, MD;  [facility];  Service: Cardiovascular;  Laterality: N/A;  diag   CORONARY STENT INTERVENTION N/A 09/14/2018   Procedure: CORONARY STENT INTERVENTION;  Surgeon: Kathleene Hazel, MD;  [facility]: [facility];  Service: Cardiovascular;  Laterality: N/A;   JOINT REPLACEMENT Left 01/2002   knee   LEFT HEART CATH AND CORONARY ANGIOGRAPHY N/A 09/14/2018   Procedure: LEFT HEART CATH AND CORONARY ANGIOGRAPHY;  Surgeon: Dolores Patty,  MD;  [facility];  Service: Cardiovascular;  Laterality: N/A;   ORIF ANKLE FRACTURE Right 08/03/2018   Procedure: OPEN REDUCTION INTERNAL FIXATION (ORIF) BIMALLEOLAR RIGHT ANKLE FRACTURE;  Surgeon: Bjorn Pippin, MD;  Location: [facility];  Service: Orthopedics;  Laterality: Right;   TONSILLECTOMY AND ADENOIDECTOMY     TUBAL LIGATION     Allergies  Allergen Reactions   Morphine And Related Itching   Percocet [Oxycodone-Acetaminophen] Itching   Prior to Admission medications   Medication Sig Start Date End Date Taking? Authorizing Provider  cholecalciferol (VITAMIN D3) 25 MCG (1000 UT) tablet Take 1,000 Units by mouth daily.    [provider]  magnesium gluconate (MAGONATE) 500 MG tablet Take 1,000 mg by mouth at bedtime.    [provider]   Social History   Socioeconomic History   Marital status: Widowed    Spouse name: Not on file   Number of children: Not on file   Years of education: Not on file   Highest education level: Not on file  Occupational History   Not on file  Tobacco Use   Smoking status: Never   Smokeless tobacco: Never  Vaping Use   Vaping Use: Never used  Substance and Sexual Activity   Alcohol use: No   Drug use: No   Sexual activity: Not on file  Other Topics Concern   Not on file  Social History Narrative   Not on file   Social Determinants of Health   Financial Resource Strain: Not on file  Food Insecurity: Not on file  Transportation Needs: Not on file  Physical Activity: Not  on file  Stress: Not on file  Social Connections: Not on file   Family History  Problem Relation Age of Onset   Arthritis Mother    Hearing loss Mother    Arthritis Father    Cancer Father        prostate   Diabetes Father    Heart disease Father    Heart disease Brother     ROS: Currently denies lightheadedness, dizziness, Fever, chills, CP, SOB.   No personal history of DVT, PE, MI, or CVA. No loose teeth or  dentures All other systems have been reviewed and were otherwise currently negative with the exception of those mentioned in the HPI and as above.  Objective: Vitals: Ht: 5'1" Wt: 172.4 lbs Temp: 97.5 BP: 120/76 Pulse: 80 O2 96% on room air.   Physical Exam: General: Alert, NAD.  Antalgic Gait  HEENT: EOMI, Good Neck Extension  Pulm: No increased work of breathing.  Clear B/L A/P w/o crackle or wheeze.  CV: RRR, No m/g/r appreciated  GI: soft, NT, ND. BS x 4 quadrants Neuro: CN II-XII grossly intact without focal deficit.  Sensation intact distally Skin: No lesions in the area of chief complaint MSK/Surgical Site:  + JLT. ROM 0-120 degrees.  5/5 strength in extension and flexion.  +EHL/FHL.  NVI.  Pain and instability with varus and valgus stress.    Imaging Review Plain radiographs demonstrate moderate degenerative joint disease of the right knee.   The overall alignment ismild valgus. The bone quality appears to be fair for age and reported activity level.  Preoperative templating of the joint replacement has been completed, documented, and submitted to the Operating Room personnel in order to optimize intra-operative equipment management.  Assessment: OA RIGHT KNEE Active Problems:   * No active hospital problems. *   Plan: Plan for Procedure(s): TOTAL KNEE ARTHROPLASTY  The patient history, physical exam, clinical judgement of the provider and imaging are consistent with end stage degenerative joint disease and total joint arthroplasty is deemed medically necessary. The treatment options including medical management, injection therapy, and arthroplasty were discussed at length. The risks and benefits of Procedure(s): TOTAL KNEE ARTHROPLASTY were presented and reviewed.  The risks of nonoperative treatment, versus surgical intervention including but not limited to continued pain, aseptic loosening, stiffness, dislocation/subluxation, infection, bleeding, nerve injury, blood  clots, cardiopulmonary complications, morbidity, mortality, among others were discussed. The patient verbalizes understanding and wishes to proceed with the plan.  Patient is being admitted for inpatient treatment for surgery, pain control, PT, prophylactic antibiotics, VTE prophylaxis, progressive ambulation, ADL's and discharge planning.   Dental prophylaxis discussed and recommended for 2 years postoperatively.  The patient does meet the criteria for TXA which will be used perioperatively.   Xarelto will be used postoperatively for DVT prophylaxis in addition to SCDs, and early ambulation. Plan for Tylenol, Celebrex, oxycodone for pain.   Robaxin for muscle spasms.   Zofran for nausea and vomiting. Pharmacy- [facility] on [address] in [place] The patient is planning to be discharged home with OPPT and into the care of her friend Bella Kennedy who can be reached at [phone] Follow up appt 11/24/21 at 4:15pm     Marzetta Board Office [phone] 10/20/2021 7:01 PM

## 2021-10-27 NOTE — Progress Notes (Signed)
COVID Vaccine received:  [x]  No []  Yes  Date of any COVID positive Test in last 83 days:None  PCP - , NP Cardiologist - 95, MD Cardiac Clearance 09-22-21 office note   Chest x-ray -   CT Coronary 2020  Epic EKG - 09-22-21  Epic  Stress Test - 2013 Epic ECHO - 2020  Epic Cardiac Cath - 2020  LHC w/ DES by Dr. 2021  Pacemaker/ICD device     [x]  N/A Spinal Cord Stimulator:[x]  No []  Yes   Other Implants:   History of Sleep Apnea? []  No [x]  Yes  []  unknown Sleep Study Date:   CPAP used?- []  No [x]  Yes  ( CPAP form sent to Respiratory)  Does the patient monitor blood sugar? 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   Anesthesia review: LHC w/ DES 08-2018, CAD, ,   Patient denies shortness of breath, fever, cough and chest pain at PAT appointment. Patient verbalized understanding and agreement to the Pre-Surgical Instructions that were given to them at this PAT appointment. Patient was also educated of the need to review these PAT instructions again prior to his/her surgery.I reviewed the appropriate phone numbers to call if they have any and questions or concerns.

## 2021-10-27 NOTE — Patient Instructions (Signed)
DUE TO SPACE LIMITATIONS, ONLY TWO VISITORS  (aged 70 and older) ARE ALLOWED TO COME WITH YOU AND STAY IN THE WAITING ROOM DURING YOUR PRE OP AND PROCEDURE.   **NO VISITORS ARE ALLOWED IN THE SHORT STAY AREA OR RECOVERY ROOM!!**   You are not required to quarantine at this time prior to your surgery. However, you must do this: Hand Hygiene often Do NOT share personal items Notify your provider if you are in close contact with someone who has COVID or you develop fever 100.4 or greater, new onset of sneezing, cough, sore throat, shortness of breath or body aches.       Your procedure is scheduled on:  Tuesday November 09, 2021  Report to [facility] Main Entrance.  Report to admitting at:  07:30   AM  +++++Call this number if you have any questions or problems the morning of surgery [phone]  Do not eat food :After Midnight the night prior to your surgery/procedure.  After Midnight you may have the following liquids until   07:00   AM DAY OF SURGERY  Clear Liquid Diet Water Black Coffee (sugar ok, NO MILK/CREAM OR CREAMERS)  Tea (sugar ok, NO MILK/CREAM OR CREAMERS) regular and decaf                             Plain Jell-O (NO RED)                                           Fruit ices (not with fruit pulp, NO RED)                                     Popsicles (NO RED)                                                                  Juice: apple, WHITE grape, WHITE cranberry Sports drinks like Gatorade (NO RED)                   The day of surgery:  Drink ONE (1) Pre-Surgery Clear Ensure at  07:00 AM the morning of surgery. Drink in one sitting. Do not sip.  This drink was given to you during your hospital pre-op appointment visit. Nothing else to drink after completing the Pre-Surgery G2.    FOLLOW ANY ADDITIONAL PRE OP INSTRUCTIONS YOU RECEIVED FROM YOUR SURGEON'S OFFICE!!!   Oral Hygiene is also important to reduce your risk of infection.        Remember -  BRUSH YOUR TEETH THE MORNING OF SURGERY WITH YOUR REGULAR TOOTHPASTE  Take ONLY these medicines the morning of surgery with A SIP OF WATER: No medication  Bring CPAP mask and tubing day of surgery.                   You may not have any metal on your body including hair pins, jewelry, and body piercing  Do not wear make-up, lotions, powders, perfumes or deodorant  Do not wear nail polish  including gel and S&S, artificial / acrylic nails, or any other type of covering on natural nails including finger and toenails. If you have artificial nails, gel coating, etc., that needs to be removed by a nail salon, Please have this removed prior to surgery. Not doing so may mean that your surgery could be cancelled or delayed if the Surgeon or anesthesia staff feels like they are unable to monitor you safely.   Do not shave 48 hours prior to surgery to avoid nicks in your skin which may contribute to postoperative infections.   DO NOT BRING YOUR HOME MEDICATIONS TO THE HOSPITAL. PHARMACY WILL DISPENSE MEDICATIONS LISTED ON YOUR MEDICATION LIST TO YOU DURING YOUR ADMISSION IN THE HOSPITAL!   Patients discharged on the day of surgery will not be allowed to drive home.  Someone NEEDS to stay with you for the first 24 hours after anesthesia.  Special Instructions: Bring a copy of your healthcare power of attorney and living will documents the day of surgery, if you wish to have them scanned into your [facility]- EPIC  Please read over the following fact sheets you were given: IF YOU HAVE QUESTIONS ABOUT YOUR PRE-OP INSTRUCTIONS, PLEASE CALL [phone]  (KAY)   [facility] - Preparing for Surgery Before surgery, you can play an important role.  Because skin is not sterile, your skin needs to be as free of germs as possible.  You can reduce the number of germs on your skin by washing with CHG (chlorahexidine gluconate) soap before surgery.  CHG is an antiseptic cleaner which kills germs and  bonds with the skin to continue killing germs even after washing. Please DO NOT use if you have an allergy to CHG or antibacterial soaps.  If your skin becomes reddened/irritated stop using the CHG and inform your nurse when you arrive at Short Stay. Do not shave (including legs and underarms) for at least 48 hours prior to the first CHG shower.  You may shave your face/neck.  Please follow these instructions carefully:  1.  Shower with CHG Soap the night before surgery and the  morning of surgery.  2.  If you choose to wash your hair, wash your hair first as usual with your normal  shampoo.  3.  After you shampoo, rinse your hair and body thoroughly to remove the shampoo.                             4.  Use CHG as you would any other liquid soap.  You can apply chg directly to the skin and wash.  Gently with a scrungie or clean washcloth.  5.  Apply the CHG Soap to your body ONLY FROM THE NECK DOWN.   Do not use on face/ open                           Wound or open sores. Avoid contact with eyes, ears mouth and genitals (private parts).                       Wash face,  Genitals (private parts) with your normal soap.             6.  Wash thoroughly, paying special attention to the area where your  surgery  will be performed.  7.  Thoroughly rinse your body with warm water from the neck down.  8.  DO NOT shower/wash with your normal soap after using and rinsing off the CHG Soap.            9.  Pat yourself dry with a clean towel.            10.  Wear clean pajamas.            11.  Place clean sheets on your bed the night of your first shower and do not  sleep with pets.  ON THE DAY OF SURGERY : Do not apply any lotions/deodorants the morning of surgery.  Please wear clean clothes to the hospital/surgery center.    FAILURE TO FOLLOW THESE INSTRUCTIONS MAY RESULT IN THE CANCELLATION OF YOUR SURGERY  PATIENT SIGNATURE_________________________________  NURSE  SIGNATURE__________________________________  ________________________________________________________________________   Alyssa Burns    An incentive spirometer is a tool that can help keep your lungs clear and active. This tool measures how well you are filling your lungs with each breath. Taking long deep breaths may help reverse or decrease the chance of developing breathing (pulmonary) problems (especially infection) following: A long period of time when you are unable to move or be active. BEFORE THE PROCEDURE  If the spirometer includes an indicator to show your best effort, your nurse or respiratory therapist will set it to a desired goal. If possible, sit up straight or lean slightly forward. Try not to slouch. Hold the incentive spirometer in an upright position. INSTRUCTIONS FOR USE  Sit on the edge of your bed if possible, or sit up as far as you can in bed or on a chair. Hold the incentive spirometer in an upright position. Breathe out normally. Place the mouthpiece in your mouth and seal your lips tightly around it. Breathe in slowly and as deeply as possible, raising the piston or the ball toward the top of the column. Hold your breath for 3-5 seconds or for as long as possible. Allow the piston or ball to fall to the bottom of the column. Remove the mouthpiece from your mouth and breathe out normally. Rest for a few seconds and repeat Steps 1 through 7 at least 10 times every 1-2 hours when you are awake. Take your time and take a few normal breaths between deep breaths. The spirometer may include an indicator to show your best effort. Use the indicator as a goal to work toward during each repetition. After each set of 10 deep breaths, practice coughing to be sure your lungs are clear. If you have an incision (the cut made at the time of surgery), support your incision when coughing by placing a pillow or rolled up towels firmly against it. Once you are able to get out  of bed, walk around indoors and cough well. You may stop using the incentive spirometer when instructed by your caregiver.  RISKS AND COMPLICATIONS Take your time so you do not get dizzy or light-headed. If you are in pain, you may need to take or ask for pain medication before doing incentive spirometry. It is harder to take a deep breath if you are having pain. AFTER USE Rest and breathe slowly and easily. It can be helpful to keep track of a log of your progress. Your caregiver can provide you with a simple table to help with this. If you are using the spirometer at home, follow these instructions: [place] IF:  You are having difficultly using the spirometer. You have trouble using the spirometer as often as  instructed. Your pain medication is not giving enough relief while using the spirometer. You develop fever of 100.5 F (38.1 C) or higher.                                                                                                    SEEK IMMEDIATE MEDICAL CARE IF:  You cough up bloody sputum that had not been present before. You develop fever of 102 F (38.9 C) or greater. You develop worsening pain at or near the incision site. MAKE SURE YOU:  Understand these instructions. Will watch your condition. Will get help right away if you are not doing well or get worse. Document Released: 07/25/2006 Document Revised: 06/06/2011 Document Reviewed: 09/25/2006 [facility] Patient Information 2014 [place], [place].

## 2021-10-28 ENCOUNTER — Encounter (HOSPITAL_COMMUNITY)
Admission: RE | Admit: 2021-10-28 | Discharge: 2021-10-28 | Disposition: A | Discharge status: Home / Self Care | Payer: Medicare Other | Source: Ambulatory Visit | Attending: Orthopedic Surgery | Admitting: Orthopedic Surgery

## 2021-10-28 ENCOUNTER — Other Ambulatory Visit: Payer: Self-pay

## 2021-10-28 ENCOUNTER — Encounter (HOSPITAL_COMMUNITY): Payer: Self-pay

## 2021-10-28 VITALS — BP 132/93 | HR 70 | Temp 98.1°F | Resp 16 | Ht 61.0 in | Wt 171.0 lb

## 2021-10-28 DIAGNOSIS — I2510 Atherosclerotic heart disease of native coronary artery without angina pectoris: Secondary | ICD-10-CM | POA: Insufficient documentation

## 2021-10-28 DIAGNOSIS — M1711 Unilateral primary osteoarthritis, right knee: Secondary | ICD-10-CM | POA: Diagnosis not present

## 2021-10-28 DIAGNOSIS — Z01818 Encounter for other preprocedural examination: Secondary | ICD-10-CM

## 2021-10-28 DIAGNOSIS — Z01812 Encounter for preprocedural laboratory examination: Secondary | ICD-10-CM | POA: Diagnosis present

## 2021-10-28 DIAGNOSIS — G4730 Sleep apnea, unspecified: Secondary | ICD-10-CM | POA: Insufficient documentation

## 2021-10-28 DIAGNOSIS — I10 Essential (primary) hypertension: Secondary | ICD-10-CM | POA: Diagnosis not present

## 2021-10-28 HISTORY — DX: Personal history of urinary calculi: Z87.442

## 2021-10-28 LAB — BASIC METABOLIC PANEL
Anion gap: 7 (ref 5–15)
BUN: 25 mg/dL — ABNORMAL HIGH (ref 8–23)
CO2: 25 mmol/L (ref 22–32)
Calcium: 9.5 mg/dL (ref 8.9–10.3)
Chloride: 111 mmol/L (ref 98–111)
Creatinine, Ser: 0.97 mg/dL (ref 0.44–1.00)
GFR, Estimated: >60 mL/min (ref >60)
Glucose, Bld: 106 mg/dL — ABNORMAL HIGH (ref 70–99)
Potassium: 4.2 mmol/L (ref 3.5–5.1)
Sodium: 143 mmol/L (ref 135–145)

## 2021-10-28 LAB — CBC
HCT: 42.5 % (ref 36.0–46.0)
Hemoglobin: 14.2 g/dL (ref 12.0–15.0)
MCH: 29.4 pg (ref 26.0–34.0)
MCHC: 33.4 g/dL (ref 30.0–36.0)
MCV: 88 fL (ref 80.0–100.0)
Platelets: 205 10*3/uL (ref 150–400)
RBC: 4.83 MIL/uL (ref 3.87–5.11)
RDW: 13.4 % (ref 11.5–15.5)
WBC: 5.6 10*3/uL (ref 4.0–10.5)
nRBC: 0 % (ref 0.0–0.2)

## 2021-10-28 LAB — SURGICAL PCR SCREEN
MRSA, PCR: NEGATIVE
Staphylococcus aureus: NEGATIVE

## 2021-10-29 NOTE — Progress Notes (Signed)
Anesthesia Chart Review   Case: [id number] Date/Time: 11/09/21 4008   Procedure: TOTAL KNEE ARTHROPLASTY (Right: Knee)   Anesthesia type: Choice   Pre-op diagnosis: OA RIGHT KNEE   Location: [facility]OM 08 / [facility]   Surgeons: Sheral Apley, MD       DISCUSSION:71 y.o. never smoker with h/o CAD (DES to LAD 2020), sleep apnea, right knee OA scheduled for above procedure 11/09/2021 with Dr. Margarita Rana.   Pt last seen by cardiology 09/22/2021. Per OV note, "Preop: The patient has no high risk findings.  She is at very high functional level.  She is not going for high risk procedure.  Therefore, based on ACC/AHA guidelines no further testing is indicated."  Anticipate pt can proceed with planned procedure barring acute status change.   VS: BP (!) 132/93   Pulse 70   Temp 36.7 C   Resp 16   Ht 5\' 1"  (1.549 m)   Wt 77.6 kg   SpO2 99%   BMI 32.31 kg/m   PROVIDERS: , NP is PCP   Cardiologist - Rebekah Chesterfield, MD LABS: Labs reviewed: Acceptable for surgery. (all labs ordered are listed, but only abnormal results are displayed)  Labs Reviewed  BASIC METABOLIC PANEL - Abnormal; Notable for the following components:      Result Value   Glucose, Bld 106 (*)    BUN 25 (*)    All other components within normal limits  SURGICAL PCR SCREEN  CBC     IMAGES:   EKG:   CV: Echo 08/23/2018 1. The left ventricle has normal systolic function with an ejection  fraction of 60-65%. The cavity size was normal. Left ventricular diastolic  Doppler parameters are indeterminate.   2. The right ventricle has normal systolic function. The cavity was  normal. There is no increase in right ventricular wall thickness.   3. Left atrial size was severely dilated.   4. Right atrial size was moderately dilated.   5. No evidence of mitral valve stenosis.   6. The aortic valve is tricuspid.   7. The aortic root is normal in size and structure.   8. Pulmonary hypertension is  indeterminate, inadequate TR jet.  Past Medical History:  Diagnosis Date   CAD (coronary artery disease)    a. LHC 09/14/18 90% LAD-DES, 90% OM-PTCA   History of kidney stones    Sleep apnea     Past Surgical History:  Procedure Laterality Date   CORONARY BALLOON ANGIOPLASTY N/A 09/14/2018   Procedure: CORONARY BALLOON ANGIOPLASTY;  Surgeon: 09/16/2018, MD;  Location: [facility];  Service: Cardiovascular;  Laterality: N/A;  diag   CORONARY STENT INTERVENTION N/A 09/14/2018   Procedure: CORONARY STENT INTERVENTION;  Surgeon: 09/16/2018, MD;  Location: [facility];  Service: Cardiovascular;  Laterality: N/A;   EYE SURGERY Bilateral    blepharoplasty   JOINT REPLACEMENT Left 01/2002   knee   LEFT HEART CATH AND CORONARY ANGIOGRAPHY N/A 09/14/2018   Procedure: LEFT HEART CATH AND CORONARY ANGIOGRAPHY;  Surgeon: 09/16/2018, MD;  Location: [facility];  Service: Cardiovascular;  Laterality: N/A;   ORIF ANKLE FRACTURE Right 08/03/2018   Procedure: OPEN REDUCTION INTERNAL FIXATION (ORIF) BIMALLEOLAR RIGHT ANKLE FRACTURE;  Surgeon: 10/03/2018, MD;  Location: [facility];  Service: Orthopedics;  Laterality: Right;   TONSILLECTOMY AND ADENOIDECTOMY     TUBAL LIGATION      MEDICATIONS:  Cholecalciferol (  VITAMIN D) 50 MCG (2000 UT) tablet   MAGNESIUM PO   No current facility-administered medications for this encounter.   Jodell Cipro Ward, PA-C [facility] Pre-Surgical Testing [phone]

## 2021-11-03 ENCOUNTER — Ambulatory Visit: Payer: Medicare Other | Admitting: Cardiology

## 2021-11-09 ENCOUNTER — Encounter (HOSPITAL_COMMUNITY)
Admission: RE | Disposition: A | Discharge status: Home / Self Care | Payer: Self-pay | Source: Ambulatory Visit | Attending: Orthopedic Surgery

## 2021-11-09 ENCOUNTER — Encounter (HOSPITAL_COMMUNITY): Payer: Self-pay | Admitting: Orthopedic Surgery

## 2021-11-09 ENCOUNTER — Ambulatory Visit (HOSPITAL_COMMUNITY): Payer: Medicare Other

## 2021-11-09 ENCOUNTER — Other Ambulatory Visit: Payer: Self-pay

## 2021-11-09 ENCOUNTER — Ambulatory Visit (HOSPITAL_COMMUNITY): Payer: Medicare Other | Admitting: Physician Assistant

## 2021-11-09 ENCOUNTER — Ambulatory Visit (HOSPITAL_BASED_OUTPATIENT_CLINIC_OR_DEPARTMENT_OTHER): Payer: Medicare Other | Admitting: Anesthesiology

## 2021-11-09 ENCOUNTER — Ambulatory Visit (HOSPITAL_COMMUNITY)
Admission: RE | Admit: 2021-11-09 | Discharge: 2021-11-09 | Disposition: A | Discharge status: Home / Self Care | Payer: Medicare Other | Source: Ambulatory Visit | Attending: Orthopedic Surgery | Admitting: Orthopedic Surgery

## 2021-11-09 DIAGNOSIS — G4733 Obstructive sleep apnea (adult) (pediatric): Secondary | ICD-10-CM | POA: Diagnosis not present

## 2021-11-09 DIAGNOSIS — I25119 Atherosclerotic heart disease of native coronary artery with unspecified angina pectoris: Secondary | ICD-10-CM

## 2021-11-09 DIAGNOSIS — I10 Essential (primary) hypertension: Secondary | ICD-10-CM | POA: Diagnosis not present

## 2021-11-09 DIAGNOSIS — Z9989 Dependence on other enabling machines and devices: Secondary | ICD-10-CM

## 2021-11-09 DIAGNOSIS — M1711 Unilateral primary osteoarthritis, right knee: Secondary | ICD-10-CM | POA: Insufficient documentation

## 2021-11-09 DIAGNOSIS — M25761 Osteophyte, right knee: Secondary | ICD-10-CM | POA: Insufficient documentation

## 2021-11-09 DIAGNOSIS — G4730 Sleep apnea, unspecified: Secondary | ICD-10-CM | POA: Diagnosis not present

## 2021-11-09 DIAGNOSIS — Z96651 Presence of right artificial knee joint: Secondary | ICD-10-CM

## 2021-11-09 HISTORY — PX: TOTAL KNEE ARTHROPLASTY: SHX125

## 2021-11-09 SURGERY — ARTHROPLASTY, KNEE, TOTAL
Anesthesia: Monitor Anesthesia Care | Site: Knee | Laterality: Right

## 2021-11-09 MED ORDER — ONDANSETRON HCL 4 MG/2ML IJ SOLN
4.0000 mg | Freq: Once | INTRAMUSCULAR | Status: DC | PRN
Start: 2021-11-09 — End: 2021-11-09

## 2021-11-09 MED ORDER — DIPHENHYDRAMINE HCL 12.5 MG/5ML PO ELIX: 12.5000 mg | ORAL_SOLUTION | ORAL | Status: DC | PRN

## 2021-11-09 MED ORDER — BISACODYL 10 MG RE SUPP
10.0000 mg | Freq: Every day | RECTAL | Status: DC | PRN
Start: 2021-11-09 — End: 2021-11-09

## 2021-11-09 MED ORDER — RIVAROXABAN 10 MG PO TABS: 10.0000 mg | ORAL_TABLET | Freq: Every day | ORAL | Status: DC

## 2021-11-09 MED ORDER — PROPOFOL 1000 MG/100ML IV EMUL
INTRAVENOUS | Status: AC
  Filled 2021-11-09: qty 100

## 2021-11-09 MED ORDER — ONDANSETRON 4 MG PO TBDP: 4.0000 mg | ORAL_TABLET | Freq: Two times a day (BID) | ORAL | 0 refills | Status: DC | PRN

## 2021-11-09 MED ORDER — PANTOPRAZOLE SODIUM 40 MG PO TBEC: 40.0000 mg | DELAYED_RELEASE_TABLET | Freq: Every day | ORAL | Status: DC

## 2021-11-09 MED ORDER — ACETAMINOPHEN 500 MG PO TABS
1000.0000 mg | ORAL_TABLET | Freq: Four times a day (QID) | ORAL | Status: DC
  Administered 2021-11-09: 1000 mg via ORAL

## 2021-11-09 MED ORDER — ROPIVACAINE HCL 7.5 MG/ML IJ SOLN
INTRAMUSCULAR | Status: DC | PRN
  Administered 2021-11-09: 20 mL via PERINEURAL

## 2021-11-09 MED ORDER — BUPIVACAINE LIPOSOME 1.3 % IJ SUSP: 20.0000 mL | Freq: Once | INTRAMUSCULAR | Status: DC

## 2021-11-09 MED ORDER — POVIDONE-IODINE 10 % EX SWAB: 2.0000 | Freq: Once | CUTANEOUS | Status: DC

## 2021-11-09 MED ORDER — POVIDONE-IODINE 10 % EX SWAB
2.0000 | Freq: Once | CUTANEOUS | Status: AC
  Administered 2021-11-09: 2 via TOPICAL

## 2021-11-09 MED ORDER — ACETAMINOPHEN 500 MG PO TABS
1000.0000 mg | ORAL_TABLET | Freq: Once | ORAL | Status: AC
  Administered 2021-11-09: 1000 mg via ORAL
  Filled 2021-11-09: qty 2

## 2021-11-09 MED ORDER — ACETAMINOPHEN 160 MG/5ML PO SOLN: 325.0000 mg | ORAL | Status: DC | PRN

## 2021-11-09 MED ORDER — ACETAMINOPHEN 10 MG/ML IV SOLN
INTRAVENOUS | Status: AC
  Filled 2021-11-09: qty 100

## 2021-11-09 MED ORDER — LACTATED RINGERS IV BOLUS
250.0000 mL | Freq: Once | INTRAVENOUS | Status: AC
  Administered 2021-11-09: 250 mL via INTRAVENOUS

## 2021-11-09 MED ORDER — DEXAMETHASONE INJECTION ORDERABLE
Status: DC | PRN
  Administered 2021-11-09: 8 mg via INTRAVENOUS

## 2021-11-09 MED ORDER — BUPIVACAINE LIPOSOME 1.3 % IJ SUSP
INTRAMUSCULAR | Status: DC | PRN
  Administered 2021-11-09: 20 mL

## 2021-11-09 MED ORDER — METHOCARBAMOL 750 MG PO TABS: 750.0000 mg | ORAL_TABLET | Freq: Three times a day (TID) | ORAL | 0 refills | Status: DC | PRN

## 2021-11-09 MED ORDER — OXYCODONE HCL 5 MG/5ML PO SOLN: 5.0000 mg | Freq: Once | ORAL | Status: DC | PRN

## 2021-11-09 MED ORDER — LACTATED RINGERS IV SOLN: INTRAVENOUS | Status: DC

## 2021-11-09 MED ORDER — DOCUSATE SODIUM 100 MG PO CAPS: 100.0000 mg | ORAL_CAPSULE | Freq: Two times a day (BID) | ORAL | Status: DC

## 2021-11-09 MED ORDER — OXYCODONE HCL 5 MG PO TABS
ORAL_TABLET | ORAL | Status: AC
  Filled 2021-11-09: qty 3

## 2021-11-09 MED ORDER — PHENYLEPHRINE HCL (PRESSORS) 10 MG/ML IV SOLN
INTRAVENOUS | Status: AC
  Filled 2021-11-09: qty 1

## 2021-11-09 MED ORDER — FENTANYL CITRATE PF 50 MCG/ML IJ SOSY: 25.0000 ug | PREFILLED_SYRINGE | INTRAMUSCULAR | Status: DC | PRN

## 2021-11-09 MED ORDER — ACETAMINOPHEN 325 MG PO TABS: 325.0000 mg | ORAL_TABLET | ORAL | Status: DC | PRN

## 2021-11-09 MED ORDER — PHENYLEPHRINE 80 MCG/ML (10ML) SYRINGE FOR IV PUSH (FOR BLOOD PRESSURE SUPPORT)
PREFILLED_SYRINGE | INTRAVENOUS | Status: DC | PRN
  Administered 2021-11-09: 80 ug via INTRAVENOUS

## 2021-11-09 MED ORDER — OXYCODONE HCL 5 MG PO TABS: 5.0000 mg | ORAL_TABLET | Freq: Four times a day (QID) | ORAL | 0 refills | Status: DC | PRN

## 2021-11-09 MED ORDER — PROPOFOL 10 MG/ML IV BOLUS
INTRAVENOUS | Status: AC
  Filled 2021-11-09: qty 20

## 2021-11-09 MED ORDER — METHOCARBAMOL 500 MG PO TABS: 500.0000 mg | ORAL_TABLET | Freq: Four times a day (QID) | ORAL | Status: DC | PRN

## 2021-11-09 MED ORDER — METOCLOPRAMIDE HCL 5 MG/ML IJ SOLN: 5.0000 mg | Freq: Three times a day (TID) | INTRAMUSCULAR | Status: DC | PRN

## 2021-11-09 MED ORDER — CEFAZOLIN SODIUM-DEXTROSE 2-4 GM/100ML-% IV SOLN
2.0000 g | INTRAVENOUS | Status: AC
  Administered 2021-11-09: 2 g via INTRAVENOUS
  Filled 2021-11-09: qty 100

## 2021-11-09 MED ORDER — TRANEXAMIC ACID-NACL 1000-0.7 MG/100ML-% IV SOLN
INTRAVENOUS | Status: AC
  Administered 2021-11-09: 1000 mg via INTRAVENOUS
  Filled 2021-11-09: qty 100

## 2021-11-09 MED ORDER — MEPERIDINE HCL 50 MG/ML IJ SOLN: 6.2500 mg | INTRAMUSCULAR | Status: DC | PRN

## 2021-11-09 MED ORDER — ACETAMINOPHEN 500 MG PO TABS
ORAL_TABLET | ORAL | Status: AC
  Filled 2021-11-09: qty 2

## 2021-11-09 MED ORDER — LACTATED RINGERS IV BOLUS
500.0000 mL | Freq: Once | INTRAVENOUS | Status: AC
  Administered 2021-11-09: 500 mL via INTRAVENOUS

## 2021-11-09 MED ORDER — POLYETHYLENE GLYCOL 3350 17 G PO PACK: 17.0000 g | PACK | Freq: Every day | ORAL | Status: DC | PRN

## 2021-11-09 MED ORDER — PROPOFOL 500 MG/50ML IV EMUL
INTRAVENOUS | Status: DC | PRN
  Administered 2021-11-09: 80 ug/kg/min via INTRAVENOUS

## 2021-11-09 MED ORDER — PHENOL 1.4 % MT LIQD: 1.0000 | OROMUCOSAL | Status: DC | PRN

## 2021-11-09 MED ORDER — 0.9 % SODIUM CHLORIDE (POUR BTL) OPTIME
TOPICAL | Status: DC | PRN
  Administered 2021-11-09: 1000 mL

## 2021-11-09 MED ORDER — DEXAMETHASONE SODIUM PHOSPHATE 10 MG/ML IJ SOLN: 8.0000 mg | Freq: Once | INTRAMUSCULAR | Status: DC

## 2021-11-09 MED ORDER — LIDOCAINE HCL (CARDIAC) PF 100 MG/5ML IV SOSY
PREFILLED_SYRINGE | INTRAVENOUS | Status: DC | PRN
  Administered 2021-11-09: 40 mg via INTRAVENOUS
  Administered 2021-11-09: 20 mg via INTRAVENOUS
  Administered 2021-11-09: 80 mg via INTRAVENOUS

## 2021-11-09 MED ORDER — ONDANSETRON HCL 4 MG PO TABS: 4.0000 mg | ORAL_TABLET | Freq: Four times a day (QID) | ORAL | Status: DC | PRN

## 2021-11-09 MED ORDER — BUPIVACAINE-EPINEPHRINE (PF) 0.5% -1:200000 IJ SOLN
INTRAMUSCULAR | Status: AC
  Filled 2021-11-09: qty 30

## 2021-11-09 MED ORDER — OXYCODONE HCL 5 MG PO TABS
10.0000 mg | ORAL_TABLET | ORAL | Status: DC | PRN
  Administered 2021-11-09: 15 mg via ORAL

## 2021-11-09 MED ORDER — WATER FOR IRRIGATION, STERILE IR SOLN
Status: DC | PRN
  Administered 2021-11-09: 2000 mL

## 2021-11-09 MED ORDER — BUPIVACAINE IN DEXTROSE 0.75-8.25 % IT SOLN
INTRATHECAL | Status: DC | PRN
  Administered 2021-11-09: 1.6 mL via INTRATHECAL

## 2021-11-09 MED ORDER — PHENYLEPHRINE HCL-NACL 20-0.9 MG/250ML-% IV SOLN
INTRAVENOUS | Status: DC | PRN
  Administered 2021-11-09: 25 ug/min via INTRAVENOUS

## 2021-11-09 MED ORDER — TRANEXAMIC ACID-NACL 1000-0.7 MG/100ML-% IV SOLN
1000.0000 mg | INTRAVENOUS | Status: AC
  Administered 2021-11-09: 1000 mg via INTRAVENOUS
  Filled 2021-11-09: qty 100

## 2021-11-09 MED ORDER — CEFAZOLIN SODIUM-DEXTROSE 1-4 GM/50ML-% IV SOLN
INTRAVENOUS | Status: AC
  Administered 2021-11-09: 1 g via INTRAVENOUS
  Filled 2021-11-09: qty 50

## 2021-11-09 MED ORDER — ACETAMINOPHEN 325 MG PO TABS: 325.0000 mg | ORAL_TABLET | Freq: Four times a day (QID) | ORAL | Status: DC | PRN

## 2021-11-09 MED ORDER — MAGNESIUM CITRATE PO SOLN
1.0000 | Freq: Once | ORAL | Status: DC | PRN
Start: 2021-11-09 — End: 2021-11-09

## 2021-11-09 MED ORDER — SODIUM CHLORIDE 0.9% FLUSH
INTRAVENOUS | Status: DC | PRN
  Administered 2021-11-09: 30 mL

## 2021-11-09 MED ORDER — RIVAROXABAN 10 MG PO TABS: 10.0000 mg | ORAL_TABLET | Freq: Every day | ORAL | 0 refills | Status: DC

## 2021-11-09 MED ORDER — OXYCODONE HCL 5 MG PO TABS: 5.0000 mg | ORAL_TABLET | Freq: Once | ORAL | Status: DC | PRN

## 2021-11-09 MED ORDER — PHENYLEPHRINE 80 MCG/ML (10ML) SYRINGE FOR IV PUSH (FOR BLOOD PRESSURE SUPPORT)
PREFILLED_SYRINGE | INTRAVENOUS | Status: AC
  Filled 2021-11-09: qty 10

## 2021-11-09 MED ORDER — BUPIVACAINE-EPINEPHRINE 0.25% -1:200000 IJ SOLN
INTRAMUSCULAR | Status: DC | PRN
  Administered 2021-11-09: 30 mL

## 2021-11-09 MED ORDER — PROPOFOL 10 MG/ML IV BOLUS
INTRAVENOUS | Status: DC | PRN
  Administered 2021-11-09: 20 mg via INTRAVENOUS
  Administered 2021-11-09: 40 mg via INTRAVENOUS

## 2021-11-09 MED ORDER — ORAL CARE MOUTH RINSE: 15.0000 mL | Freq: Once | OROMUCOSAL | Status: AC

## 2021-11-09 MED ORDER — BUPIVACAINE LIPOSOME 1.3 % IJ SUSP
INTRAMUSCULAR | Status: AC
  Filled 2021-11-09: qty 20

## 2021-11-09 MED ORDER — LIDOCAINE HCL (PF) 2 % IJ SOLN
INTRAMUSCULAR | Status: AC
  Filled 2021-11-09: qty 5

## 2021-11-09 MED ORDER — METHOCARBAMOL 500 MG IVPB - SIMPLE MED: 500.0000 mg | Freq: Four times a day (QID) | INTRAVENOUS | Status: DC | PRN

## 2021-11-09 MED ORDER — DEXAMETHASONE SODIUM PHOSPHATE 10 MG/ML IJ SOLN: 10.0000 mg | Freq: Once | INTRAMUSCULAR | Status: DC

## 2021-11-09 MED ORDER — ACETAMINOPHEN 500 MG PO TABS: 1000.0000 mg | ORAL_TABLET | Freq: Four times a day (QID) | ORAL | 0 refills | Status: AC | PRN

## 2021-11-09 MED ORDER — OXYCODONE HCL 5 MG PO TABS: 5.0000 mg | ORAL_TABLET | ORAL | Status: DC | PRN

## 2021-11-09 MED ORDER — SODIUM CHLORIDE (PF) 0.9 % IJ SOLN
INTRAMUSCULAR | Status: AC
  Filled 2021-11-09: qty 30

## 2021-11-09 MED ORDER — FENTANYL CITRATE PF 50 MCG/ML IJ SOSY
50.0000 ug | PREFILLED_SYRINGE | INTRAMUSCULAR | Status: DC
  Administered 2021-11-09: 100 ug via INTRAVENOUS
  Filled 2021-11-09: qty 2

## 2021-11-09 MED ORDER — ONDANSETRON HCL 4 MG/2ML IJ SOLN: 4.0000 mg | Freq: Four times a day (QID) | INTRAMUSCULAR | Status: DC | PRN

## 2021-11-09 MED ORDER — EPHEDRINE SULFATE-NACL 50-0.9 MG/10ML-% IV SOSY
PREFILLED_SYRINGE | INTRAVENOUS | Status: DC | PRN
  Administered 2021-11-09: 5 mg via INTRAVENOUS

## 2021-11-09 MED ORDER — METOCLOPRAMIDE HCL 5 MG PO TABS: 5.0000 mg | ORAL_TABLET | Freq: Three times a day (TID) | ORAL | Status: DC | PRN

## 2021-11-09 MED ORDER — CEFAZOLIN SODIUM-DEXTROSE 1-4 GM/50ML-% IV SOLN: 1.0000 g | Freq: Four times a day (QID) | INTRAVENOUS | Status: DC

## 2021-11-09 MED ORDER — SODIUM CHLORIDE 0.9 % IR SOLN
Status: DC | PRN
  Administered 2021-11-09: 1000 mL

## 2021-11-09 MED ORDER — CHLORHEXIDINE GLUCONATE 0.12 % MT SOLN
15.0000 mL | Freq: Once | OROMUCOSAL | Status: AC
  Administered 2021-11-09: 15 mL via OROMUCOSAL

## 2021-11-09 MED ORDER — HYDROMORPHONE HCL 1 MG/ML IJ SOLN: 0.5000 mg | INTRAMUSCULAR | Status: DC | PRN

## 2021-11-09 MED ORDER — DEXAMETHASONE SODIUM PHOSPHATE 10 MG/ML IJ SOLN
INTRAMUSCULAR | Status: DC | PRN
  Administered 2021-11-09: 10 mg

## 2021-11-09 MED ORDER — MENTHOL 3 MG MT LOZG: 1.0000 | LOZENGE | OROMUCOSAL | Status: DC | PRN

## 2021-11-09 MED ORDER — TRANEXAMIC ACID-NACL 1000-0.7 MG/100ML-% IV SOLN: 1000.0000 mg | Freq: Once | INTRAVENOUS | Status: AC

## 2021-11-09 MED ORDER — LACTATED RINGERS IV BOLUS: 250.0000 mL | Freq: Once | INTRAVENOUS | Status: DC

## 2021-11-09 MED ORDER — ONDANSETRON HCL 4 MG/2ML IJ SOLN
INTRAMUSCULAR | Status: DC | PRN
  Administered 2021-11-09: 4 mg via INTRAVENOUS

## 2021-11-09 MED ORDER — MIDAZOLAM HCL 2 MG/2ML IJ SOLN
1.0000 mg | INTRAMUSCULAR | Status: DC
  Administered 2021-11-09: 2 mg via INTRAVENOUS
  Filled 2021-11-09: qty 2

## 2021-11-09 MED ORDER — ALUM & MAG HYDROXIDE-SIMETH 200-200-20 MG/5ML PO SUSP: 30.0000 mL | ORAL | Status: DC | PRN

## 2021-11-09 SURGICAL SUPPLY — 58 items
ACE 6X15 ×1 IMPLANT
BAG COUNTER SPONGE SURGICOUNT (BAG) ×1 IMPLANT
BLADE HEX COATED 2.75 (ELECTRODE) ×2 IMPLANT
BLADE SAG 18X100X1.27 (BLADE) ×2 IMPLANT
BLADE SAGITTAL 25.0X1.37X90 (BLADE) ×2 IMPLANT
BLADE SURG 15 STRL LF DISP TIS (BLADE) ×1 IMPLANT
BLADE SURG 15 STRL SS (BLADE) ×2
BLADE SURG SZ10 CARB STEEL (BLADE) ×2 IMPLANT
BNDG ELASTIC 6X10 VLCR STRL LF (GAUZE/BANDAGES/DRESSINGS) ×2 IMPLANT
BOWL SMART MIX CTS (DISPOSABLE) IMPLANT
CLSR STERI-STRIP ANTIMIC 1/2X4 (GAUZE/BANDAGES/DRESSINGS) ×2 IMPLANT
COMPONENT TRI CR RETAIN KNEE (Orthopedic Implant) IMPLANT
COVER SURGICAL LIGHT HANDLE (MISCELLANEOUS) ×2 IMPLANT
CUFF TOURN SGL QUICK 34 (TOURNIQUET CUFF) ×2
CUFF TRNQT CYL 34X4.125X (TOURNIQUET CUFF) ×1 IMPLANT
DRAPE U-SHAPE 47X51 STRL (DRAPES) ×2 IMPLANT
DRESSING MEPILEX BORDER 6X8 (GAUZE/BANDAGES/DRESSINGS) IMPLANT
DRSG MEPILEX BORDER 6X8 (GAUZE/BANDAGES/DRESSINGS) ×2
DRSG MEPILEX POST OP 4X12 (GAUZE/BANDAGES/DRESSINGS) ×2 IMPLANT
DURAPREP 26ML APPLICATOR (WOUND CARE) ×4 IMPLANT
GLOVE BIO SURGEON STRL SZ7.5 (GLOVE) ×4 IMPLANT
GLOVE BIOGEL M 6.5 STRL (GLOVE) ×4 IMPLANT
GLOVE BIOGEL PI IND STRL 6.5 (GLOVE) IMPLANT
GLOVE BIOGEL PI IND STRL 7.5 (GLOVE) ×1 IMPLANT
GLOVE BIOGEL PI IND STRL 8 (GLOVE) ×1 IMPLANT
GLOVE BIOGEL PI INDICATOR 6.5 (GLOVE) ×1
GLOVE BIOGEL PI INDICATOR 7.5 (GLOVE) ×3
GLOVE BIOGEL PI INDICATOR 8 (GLOVE) ×1
GLOVE SURG SYN 7.5  E (GLOVE) ×2
GLOVE SURG SYN 7.5 E (GLOVE) ×1 IMPLANT
GLOVE SURG SYN 7.5 PF PI (GLOVE) ×1 IMPLANT
GOWN SPEC L4 XLG W/TWL (GOWN DISPOSABLE) ×2 IMPLANT
GOWN STRL REUS W/ TWL LRG LVL3 (GOWN DISPOSABLE) ×1 IMPLANT
GOWN STRL REUS W/TWL LRG LVL3 (GOWN DISPOSABLE) ×6
HANDPIECE INTERPULSE COAX TIP (DISPOSABLE) ×2
HOLDER FOLEY CATH W/STRAP (MISCELLANEOUS) ×1 IMPLANT
IMMOBILIZER KNEE 20 (SOFTGOODS) ×1 IMPLANT
IMMOBILIZER KNEE 22 UNIV (SOFTGOODS) ×2 IMPLANT
INSERT TRIATH X3 SZ4 9 (Insert) ×1 IMPLANT
KNEE PATELLA ASYMMETRIC 9X29 (Knees) ×1 IMPLANT
KNEE TIBIAL COMP TRI SZ4 (Knees) ×1 IMPLANT
MANIFOLD NEPTUNE II (INSTRUMENTS) ×2 IMPLANT
NS IRRIG 1000ML POUR BTL (IV SOLUTION) ×2 IMPLANT
PACK TOTAL KNEE CUSTOM (KITS) ×2 IMPLANT
PIN FLUTED HEDLESS FIX 3.5X1/8 (PIN) ×1 IMPLANT
PROTECTOR NERVE ULNAR (MISCELLANEOUS) ×2 IMPLANT
SET HNDPC FAN SPRY TIP SCT (DISPOSABLE) ×1 IMPLANT
SPIKE FLUID TRANSFER (MISCELLANEOUS) ×2 IMPLANT
STERI STRIP BROWN 1/4X3 R155 (GAUZE/BANDAGES/DRESSINGS) ×1 IMPLANT
SUT MNCRL AB 3-0 PS2 18 (SUTURE) ×2 IMPLANT
SUT VIC AB 0 CT1 36 (SUTURE) ×2 IMPLANT
SUT VIC AB 1 CT1 36 (SUTURE) ×4 IMPLANT
SUT VIC AB 2-0 CT1 27 (SUTURE) ×2
SUT VIC AB 2-0 CT1 TAPERPNT 27 (SUTURE) ×1 IMPLANT
TRAY FOLEY MTR SLVR 14FR STAT (SET/KITS/TRAYS/PACK) ×1 IMPLANT
TRIA CRUCIATE RETAIN KNEE (Orthopedic Implant) ×2 IMPLANT
TUBE SUCTION HIGH CAP CLEAR NV (SUCTIONS) ×2 IMPLANT
WRAP KNEE MAXI GEL POST OP (GAUZE/BANDAGES/DRESSINGS) ×2 IMPLANT

## 2021-11-09 NOTE — Anesthesia Preprocedure Evaluation (Addendum)
Anesthesia Evaluation  Patient identified by MRN, date of birth, ID band Patient awake    Reviewed: Allergy & Precautions, NPO status , Patient's Chart, lab work & pertinent test results  History of Anesthesia Complications Negative for: history of anesthetic complications  Airway Mallampati: II  TM Distance: >3 FB Neck ROM: Full    Dental  (+) Dental Advisory Given, Teeth Intact   Pulmonary sleep apnea and Continuous Positive Airway Pressure Ventilation ,    breath sounds clear to auscultation       Cardiovascular hypertension, + angina + CAD   Rhythm:Regular Rate:Normal  Prox LAD to Mid LAD lesion is 90% stenosed. 1st Diag lesion is 99% stenosed. A drug-eluting stent was successfully placed using a STENT SYNERGY DES 2.25X20. Post intervention, there is a 0% residual stenosis. Balloon angioplasty was performed using a BALLOON SAPPHIRE 2.5X12. Post intervention, there is a 60% residual stenosis.    Neuro/Psych negative neurological ROS  negative psych ROS   GI/Hepatic negative GI ROS, Neg liver ROS,   Endo/Other   Obesity   Renal/GU negative Renal ROS     Musculoskeletal negative musculoskeletal ROS (+)   Abdominal   Peds  Hematology negative hematology ROS (+)   Anesthesia Other Findings   Reproductive/Obstetrics                            Anesthesia Physical  Anesthesia Plan  ASA: 3  Anesthesia Plan: MAC, Spinal and Regional   Post-op Pain Management: Minimal or no pain anticipated and Regional block*   Induction: Intravenous  PONV Risk Score and Plan: 3 and Treatment may vary due to age or medical condition and Propofol infusion  Airway Management Planned: Natural Airway, Nasal Cannula and Simple Face Mask  Additional Equipment: None  Intra-op Plan:   Post-operative Plan: Extubation in OR  Informed Consent: I have reviewed the patients History and Physical, chart,  labs and discussed the procedure including the risks, benefits and alternatives for the proposed anesthesia with the patient or authorized representative who has indicated his/her understanding and acceptance.     Dental advisory given  Plan Discussed with: CRNA and Anesthesiologist  Anesthesia Plan Comments:         Anesthesia Quick Evaluation

## 2021-11-09 NOTE — Anesthesia Procedure Notes (Addendum)
  Anesthesia Regional Block: Adductor canal block   Pre-Anesthetic Checklist: , timeout performed,  Correct Patient, Correct Site, Correct Laterality,  Correct Procedure, Correct Position, site marked,  Risks and benefits discussed,  Surgical consent,  Pre-op evaluation,  At surgeon's request and post-op pain management  Laterality: Right  Prep: chloraprep       Needles:  Injection technique: Single-shot  Needle Type: Echogenic Stimulator Needle     Needle Length: 5cm  Needle Gauge: 22     Additional Needles:   Procedures:, nerve stimulator,,, ultrasound used (permanent image in chart),,    Narrative:  Start time: 11/09/2021 8:45 AM End time: 11/09/2021 8:50 AM Injection made incrementally with aspirations every 5 mL.  Performed by: Personally  Anesthesiologist: Bethena Midget, MD  Additional Notes: Functioning IV was confirmed and monitors were applied.  A 13mm 22ga Arrow echogenic stimulator needle was used. Sterile prep and drape,hand hygiene and sterile gloves were used. Ultrasound guidance: relevant anatomy identified, needle position confirmed, local anesthetic spread visualized around nerve(s)., vascular puncture avoided.  Image printed for medical record. Negative aspiration and negative test dose prior to incremental administration of local anesthetic. The patient tolerated the procedure well.

## 2021-11-09 NOTE — Discharge Instructions (Signed)

## 2021-11-09 NOTE — Anesthesia Procedure Notes (Signed)
Spinal  Patient location during procedure: OR Start time: 11/09/2021 10:10 AM End time: 11/09/2021 10:13 AM Reason for block: surgical anesthesia Staffing Performed: resident/CRNA  Anesthesiologist: Bethena Midget, MD Resident/CRNA: Yolonda Kida, CRNA Performed by: Yolonda Kida, CRNA Authorized by: Bethena Midget, MD   Preanesthetic Checklist Completed: patient identified, IV checked, site marked, risks and benefits discussed, surgical consent, monitors and equipment checked, pre-op evaluation and timeout performed Spinal Block Patient position: sitting Prep: DuraPrep Patient monitoring: blood pressure, continuous pulse ox and heart rate Approach: midline Location: L3-4 Injection technique: single-shot Needle Needle type: Pencan  Needle gauge: 24 G Assessment Sensory level: T6 Events: CSF return

## 2021-11-09 NOTE — Evaluation (Signed)
Physical Therapy Evaluation Patient Details Name: Alyssa Burns MRN: [id number] DOB: 15-May-1950 Today's Date: 11/09/2021  History of Present Illness  Pt is a 71yo female presenting s/p R-TKA on 11/09/21. PMH: CAD s/p cath/stent/angioplasty, L-TKA 2003, R-ankle ORIF 2020   Clinical Impression  Chele Cornell is a 71 y.o. female POD 0 s/p R-TKA. Patient reports independence with mobility at baseline. Patient is now limited by functional impairments (see PT problem list below) and requires min guard for transfers and gait with RW. Patient was able to ambulate 40 feet with RW and min guard and cues for safe walker management. Patient instructed in exercises to facilitate ROM and circulation. Patient will benefit from continued skilled PT interventions to address impairments and progress towards PLOF. Patient has met mobility goals at adequate level for discharge home; will continue to follow if pt continues acute stay to progress towards Mod I goals.        Recommendations for follow up therapy are one component of a multi-disciplinary discharge planning process, led by the attending physician.  Recommendations may be updated based on patient status, additional functional criteria and insurance authorization.  Follow Up Recommendations Follow physician's recommendations for discharge plan and follow up therapies      Assistance Recommended at Discharge Set up Supervision/Assistance  Patient can return home with the following  A little help with walking and/or transfers;A little help with bathing/dressing/bathroom;Assistance with cooking/housework;Assist for transportation;Help with stairs or ramp for entrance    Equipment Recommendations None recommended by PT  Recommendations for Other Services       Functional Status Assessment Patient has had a recent decline in their functional status and demonstrates the ability to make significant improvements in function in a reasonable and  predictable amount of time.     Precautions / Restrictions Precautions Precautions: None Required Braces or Orthoses: Knee Immobilizer - Right Knee Immobilizer - Right: On when out of bed or walking Restrictions Weight Bearing Restrictions: No Other Position/Activity Restrictions: WBAT      Mobility  Bed Mobility Overal bed mobility: Needs Assistance Bed Mobility: Supine to Sit     Supine to sit: Min guard     General bed mobility comments: for safety only, no physical assist required    Transfers Overall transfer level: Needs assistance Equipment used: Rolling walker (2 wheels) Transfers: Sit to/from Stand Sit to Stand: Min guard           General transfer comment: No physical assist required. Pt demonstrated mild anterior lean but was able to self correct.    Ambulation/Gait Ambulation/Gait assistance: Min guard, Min assist Gait Distance (Feet): 60 Feet Assistive device: Rolling walker (2 wheels) Gait Pattern/deviations: Step-to pattern, Knee hyperextension - right, Knee hyperextension - left Gait velocity: decreased     General Gait Details: Pt ambulated with RW and min assist for steadying progressed to min guard, no overt LOB noted. Pt demonstrated bilateral knee hyperextension, after ambulation task pt noted bilateral glutes were numb so suspect that this hyperextension was a compesnation for reduced hip motor control.  Stairs            Wheelchair Mobility    Modified Rankin (Stroke Patients Only)       Balance Overall balance assessment: Needs assistance Sitting-balance support: No upper extremity supported, Feet supported Sitting balance-Leahy Scale: Good     Standing balance support: During functional activity, Reliant on assistive device for balance, Bilateral upper extremity supported Standing balance-Leahy Scale: Poor  Pertinent Vitals/Pain Pain Assessment Pain Assessment: No/denies pain     Home Living Family/patient expects to be discharged to:: Private residence Living Arrangements: Alone Available Help at Discharge: Friend(s);Available PRN/intermittently (24/7 Tonight, during daytime hours) Type of Home: House Home Access: Ramped entrance       Home Layout: One level Home Equipment: BSC/3in1;Rolling Environmental consultant (2 wheels)      Prior Function Prior Level of Function : Independent/Modified Independent             Mobility Comments: ind ADLs Comments: ind     Hand Dominance        Extremity/Trunk Assessment   Upper Extremity Assessment Upper Extremity Assessment: Overall WFL for tasks assessed    Lower Extremity Assessment Lower Extremity Assessment: RLE deficits/detail;LLE deficits/detail RLE Deficits / Details: MMT ank DF/PF 5/5, no extensor lag noted RLE Sensation: WNL LLE Deficits / Details: MMT ank DF/PF 5/5 LLE Sensation: WNL    Cervical / Trunk Assessment Cervical / Trunk Assessment: Normal  Communication   Communication: No difficulties  Cognition Arousal/Alertness: Awake/alert Behavior During Therapy: WFL for tasks assessed/performed Overall Cognitive Status: Within Functional Limits for tasks assessed                                          General Comments      Exercises Total Joint Exercises Ankle Circles/Pumps: AROM, Both, 10 reps Quad Sets: AROM, Right, Other reps (comment) (3) Short Arc Quad: AROM, Right, Other reps (comment) (3) Heel Slides: AROM, Right, Other reps (comment) (3) Hip ABduction/ADduction: AROM, Right, Other reps (comment) (3) Straight Leg Raises: AROM, Right, Other reps (comment) (3)   Assessment/Plan    PT Assessment Patient needs continued PT services  PT Problem List Decreased strength;Decreased range of motion;Decreased activity tolerance;Decreased balance;Decreased mobility;Decreased coordination;Pain       PT Treatment Interventions DME instruction;Gait training;Stair  training;Functional mobility training;Therapeutic activities;Therapeutic exercise;Balance training;Neuromuscular re-education;Patient/family education    PT Goals (Current goals can be found in the Care Plan section)  Acute Rehab PT Goals Patient Stated Goal: to walk better PT Goal Formulation: With patient Time For Goal Achievement: 11/16/21 Potential to Achieve Goals: Good    Frequency 7X/week     Co-evaluation               AM-PAC PT "6 Clicks" Mobility  Outcome Measure Help needed turning from your back to your side while in a flat bed without using bedrails?: None Help needed moving from lying on your back to sitting on the side of a flat bed without using bedrails?: None Help needed moving to and from a bed to a chair (including a wheelchair)?: A Little Help needed standing up from a chair using your arms (e.g., wheelchair or bedside chair)?: A Little Help needed to walk in hospital room?: A Little Help needed climbing 3-5 steps with a railing? : A Little 6 Click Score: 20    End of Session Equipment Utilized During Treatment: Gait belt Activity Tolerance: Patient tolerated treatment well;No increased pain Patient left: in chair;with call bell/phone within reach;with family/visitor present Nurse Communication: Mobility status PT Visit Diagnosis: Pain;Difficulty in walking, not elsewhere classified (R26.2) Pain - Right/Left: Right Pain - part of body: Knee    Time: 1610-9604 PT Time Calculation (min) (ACUTE ONLY): 30 min   Charges:   PT Evaluation $PT Eval Low Complexity: 1 Low PT Treatments $Gait Training: 8-22 mins  Coolidge Breeze, PT, DPT [facility] Office: [phone] Pager: [phone]  Coolidge Breeze 11/09/2021, 6:27 PM

## 2021-11-09 NOTE — Interval H&P Note (Signed)
History and Physical Interval Note:  11/09/2021 9:02 AM  Alyssa Burns  has presented today for surgery, with the diagnosis of OA RIGHT KNEE.  The various methods of treatment have been discussed with the patient and family. After consideration of risks, benefits and other options for treatment, the patient has consented to  Procedure(s): TOTAL KNEE ARTHROPLASTY (Right) as a surgical intervention.  The patient's history has been reviewed, patient examined, no change in status, stable for surgery.  I have reviewed the patient's chart and labs.  Questions were answered to the patient's satisfaction.     Sheral Apley

## 2021-11-09 NOTE — Progress Notes (Signed)
Orthopedic Tech Progress Note Patient Details:  Alyssa Burns 1950/10/05 [id number]  Patient ID: Alyssa Burns, female   DOB: 18-Oct-1950, 71 y.o.   MRN: [id number]  Alyssa Burns 11/09/2021, 1:25 PM Right bone foam applied in pacu

## 2021-11-09 NOTE — Op Note (Signed)
DATE OF SURGERY:  11/09/2021 TIME: 11:24 AM  PATIENT NAME:  Alyssa Burns   AGE: 71 y.o.    PRE-OPERATIVE DIAGNOSIS:  OA RIGHT KNEE  POST-OPERATIVE DIAGNOSIS:  Same  PROCEDURE:  Procedure(s): TOTAL KNEE ARTHROPLASTY   SURGEON:  Sheral Apley, MD   ASSISTANT:  Levester Fresh, PA-C, he was present and scrubbed throughout the case, critical for completion in a timely fashion, and for retraction, instrumentation, and closure.    OPERATIVE IMPLANTS: [employer] Triathlon CR. Press fit knee  Femur size 3, Tibia size 4, Patella size 29 3-peg oval button, with a 9 mm polyethylene insert.   PREOPERATIVE INDICATIONS:  Alyssa Burns is a 71 y.o. year old female with end stage bone on bone degenerative arthritis of the knee who failed conservative treatment, including injections, antiinflammatories, activity modification, and assistive devices, and had significant impairment of their activities of daily living, and elected for Total Knee Arthroplasty.   The risks, benefits, and alternatives were discussed at length including but not limited to the risks of infection, bleeding, nerve injury, stiffness, blood clots, the need for revision surgery, cardiopulmonary complications, among others, and they were willing to proceed.   OPERATIVE DESCRIPTION:  The patient was brought to the operative room and placed in a supine position.  General anesthesia was administered.  IV antibiotics were given.  The lower extremity was prepped and draped in the usual sterile fashion.  Time out was performed.  The leg was elevated and exsanguinated and the tourniquet was inflated.  Anterior approach was performed.  The patella was everted and osteophytes were removed.  The anterior horn of the medial and lateral meniscus was removed.   The distal femur was opened with the drill and the intramedullary distal femoral cutting jig was utilized, set at 5 degrees resecting 9 mm off the distal femur.  Care was  taken to protect the collateral ligaments.  The distal femoral sizing jig was applied, taking care to avoid notching.  Then the 4-in-1 cutting jig was applied and the anterior and posterior femur was cut, along with the chamfer cuts.  All posterior osteophytes were removed.  The flexion gap was then measured and was symmetric with the extension gap.  Then the extramedullary tibial cutting jig was utilized making the appropriate cut using the anterior tibial crest as a reference building in appropriate posterior slope.  Care was taken during the cut to protect the medial and collateral ligaments.  The proximal tibia was removed along with the posterior horns of the menisci.  The PCL was sacrificed.    The extensor gap was measured and was approximately 98mm.    I completed the distal femoral preparation using the appropriate jig to prepare the box.  The patella was then measured, and cut with the saw.    The proximal tibia sized and prepared accordingly with the reamer and the punch, and then all components were trialed with the above sized poly insert.  The knee was found to have excellent balance and full motion.    The above named components were then impacted into place and Poly tibial piece and patella were inserted.  I was very happy with his stability and ROM  I performed a periarticular injection with marcaine and toradol  The knee was easily taken through a range of motion and the patella tracked well and the knee irrigated copiously and the parapatellar and subcutaneous tissue closed with vicryl, and monocryl with steri strips for the skin.  The  incision was dressed with sterile gauze and the tourniquet released and the patient was awakened and returned to the PACU in stable and satisfactory condition.  There were no complications.  Total tourniquet time was roughly 60 minutes.   POSTOPERATIVE PLAN: post op Abx, DVT px: SCD's, TED's, Early ambulation and chemical px

## 2021-11-09 NOTE — Transfer of Care (Signed)
Immediate Anesthesia Transfer of Care Note  Patient: Alyssa Burns  Procedure(s) Performed: TOTAL KNEE ARTHROPLASTY (Right: Knee)  Patient Location: PACU  Anesthesia Type:Spinal  Level of Consciousness: awake, drowsy and patient cooperative  Airway & Oxygen Therapy: Patient Spontanous Breathing and Patient connected to face mask oxygen  Post-op Assessment: Report given to RN and Post -op Vital signs reviewed and stable  Post vital signs: Reviewed and stable  Last Vitals:  Vitals Value Taken Time  BP 106/60 11/09/21 1208  Temp    Pulse 70 11/09/21 1208  Resp 18 11/09/21 1208  SpO2 97 % 11/09/21 1208  Vitals shown include unvalidated device data.  Last Pain:  Vitals:   11/09/21 0750  TempSrc:   PainSc: 0-No pain         Complications: No notable events documented.

## 2021-11-10 ENCOUNTER — Encounter (HOSPITAL_COMMUNITY): Payer: Self-pay | Admitting: Orthopedic Surgery

## 2021-11-10 MED FILL — Dexamethasone Sodium Phosphate Inj 10 MG/ML: INTRAMUSCULAR | Qty: 10 | Status: AC

## 2021-11-10 MED FILL — Dexamethasone Sodium Phosphate Inj 10 MG/ML: INTRAMUSCULAR | Qty: 8 | Status: AC

## 2021-11-10 NOTE — Anesthesia Postprocedure Evaluation (Signed)
Anesthesia Post Note  Patient: Alyssa Burns  Procedure(s) Performed: TOTAL KNEE ARTHROPLASTY (Right: Knee)     Patient location during evaluation: PACU Anesthesia Type: Regional, Spinal and MAC Level of consciousness: oriented and awake and alert Pain management: pain level controlled Vital Signs Assessment: post-procedure vital signs reviewed and stable Respiratory status: spontaneous breathing, respiratory function stable and patient connected to nasal cannula oxygen Cardiovascular status: blood pressure returned to baseline and stable Postop Assessment: no headache, no backache and no apparent nausea or vomiting Anesthetic complications: no   No notable events documented.  Last Vitals:  Vitals:   11/09/21 1600 11/09/21 1700  BP: (!) 142/85 132/78  Pulse: 71 77  Resp: 12 13  Temp:    SpO2: 99% 100%    Last Pain:  Vitals:   11/09/21 1700  TempSrc:   PainSc: 0-No pain   Pain Goal:                   Alyssa Burns

## 2021-11-11 ENCOUNTER — Ambulatory Visit: Payer: Medicare Other | Attending: Orthopedic Surgery

## 2021-11-11 ENCOUNTER — Other Ambulatory Visit: Payer: Self-pay

## 2021-11-11 DIAGNOSIS — M25561 Pain in right knee: Secondary | ICD-10-CM | POA: Diagnosis present

## 2021-11-11 DIAGNOSIS — M25661 Stiffness of right knee, not elsewhere classified: Secondary | ICD-10-CM | POA: Insufficient documentation

## 2021-11-11 NOTE — Therapy (Signed)
OUTPATIENT PHYSICAL THERAPY LOWER EXTREMITY EVALUATION   Patient Name: Alyssa Burns MRN: [id number] DOB:March 02, 1951, 71 y.o., female Today's Date: 11/12/2021   PT End of Session - 11/11/21 1012     Visit Number 1    Number of Visits 15    Date for PT Re-Evaluation 01/21/22    PT Start Time 1019    PT Stop Time 1115    PT Time Calculation (min) 56 min    Activity Tolerance Patient limited by pain    Behavior During Therapy [facility] for tasks assessed/performed             Past Medical History:  Diagnosis Date   CAD (coronary artery disease)    a. LHC 09/14/18 90% LAD-DES, 90% OM-PTCA   History of kidney stones    Sleep apnea    Past Surgical History:  Procedure Laterality Date   CORONARY BALLOON ANGIOPLASTY N/A 09/14/2018   Procedure: CORONARY BALLOON ANGIOPLASTY;  Surgeon: Kathleene Hazel, MD;  [facility];  Service: Cardiovascular;  Laterality: N/A;  diag   CORONARY STENT INTERVENTION N/A 09/14/2018   Procedure: CORONARY STENT INTERVENTION;  Surgeon: Kathleene Hazel, MD;  [facility];  Service: Cardiovascular;  Laterality: N/A;   EYE SURGERY Bilateral    blepharoplasty   JOINT REPLACEMENT Left 01/2002   knee   LEFT HEART CATH AND CORONARY ANGIOGRAPHY N/A 09/14/2018   Procedure: LEFT HEART CATH AND CORONARY ANGIOGRAPHY;  Surgeon: Dolores Patty, MD;  [facility];  Service: Cardiovascular;  Laterality: N/A;   ORIF ANKLE FRACTURE Right 08/03/2018   Procedure: OPEN REDUCTION INTERNAL FIXATION (ORIF) BIMALLEOLAR RIGHT ANKLE FRACTURE;  Surgeon: Bjorn Pippin, MD;  Location: [facility];  Service: Orthopedics;  Laterality: Right;   TONSILLECTOMY AND ADENOIDECTOMY     TOTAL KNEE ARTHROPLASTY Right 11/09/2021   Procedure: TOTAL KNEE ARTHROPLASTY;  Surgeon: Sheral Apley, MD;  Location: [facility];  Service: Orthopedics;  Laterality: Right;   TUBAL LIGATION     Patient Active Problem List    Diagnosis Date Noted   Preop cardiovascular exam 09/21/2021   Dyslipidemia 06/23/2020   OSA on CPAP 06/23/2020   Essential hypertension 06/23/2020   Coronary artery disease 06/23/2020   Unstable angina (HCC) 09/14/2018   Elevated BP without diagnosis of hypertension 06/10/2016    PCP: Rebekah Chesterfield, NP  REFERRING PROVIDER: Sheral Apley, MD  REFERRING DIAG: Right Total Knee Replacement-DOS 11/09/21  THERAPY DIAG:  Acute pain of right knee  Stiffness of right knee, not elsewhere classified  Rationale for Evaluation and Treatment Rehabilitation  ONSET DATE: 11/09/21  SUBJECTIVE:   SUBJECTIVE STATEMENT: Patient reports that she had a right knee replacement on 11/09/21. She notes that her nerve block wore off yesterday. She used her CPM machine yesterday and had to change it to 46 degrees.She used her CPM machine for about 20 minutes at a time.   PERTINENT HISTORY: History of right ankle fracture, angina, HTN  PAIN:  Are you having pain? Yes: NPRS scale: 10/10 Pain location: right thigh and knee Pain description: sore, burning, throbbing, aching Aggravating factors: moving, walking Relieving factors: ice  PRECAUTIONS: None  WEIGHT BEARING RESTRICTIONS No  FALLS:  Has patient fallen in last 6 months? No  LIVING ENVIRONMENT: Lives with: lives with their family Lives in: House/apartment Stairs: No Has following equipment at home: Ramped entry  OCCUPATION: retired  PLOF: Independent  PATIENT GOALS be able to travel to [place] (  October 17-18), walk without a walker, return to the gym   OBJECTIVE:   PATIENT SURVEYS:  FOTO 23.31  COGNITION:  Overall cognitive status: Within functional limits for tasks assessed     SENSATION: WFL  PALPATION: TTP: right knee  LOWER EXTREMITY ROM:  Active ROM Right eval Right PROM Left eval  Hip flexion     Hip extension     Hip abduction     Hip adduction     Hip internal rotation     Hip external  rotation     Knee flexion 26 41 126  Knee extension 11  0  Ankle dorsiflexion     Ankle plantarflexion     Ankle inversion     Ankle eversion      (Blank rows = not tested)  GAIT: Assistive device utilized: Environmental consultant - 2 wheeled Level of assistance: Modified independence Comments: step to pattern  TODAY'S TREATMENT:                                   8/17 EXERCISE LOG  Exercise Repetitions and Resistance Comments  Quad sets 5 second hold x 10 reps   Gastroc stretch  4 x 30 seconds   Heel slides 10 reps            Blank cell = exercise not performed today  Modalities  Date:  Vaso: Knee, 34 degrees, 15 mins, Pain and Edema   PATIENT EDUCATION:  Education details: HEP, POC, prognosis, healing Person educated: Patient Education method: Explanation Education comprehension: verbalized understanding   HOME EXERCISE PROGRAM: [id number]  ASSESSMENT:  CLINICAL IMPRESSION: Patient is a 71 y.o. female who was seen today for physical therapy evaluation and treatment following a right knee replacement on 11/09/21. She presented with high pain severity and irritability which limited her right knee ROM and mobility. She exhibited increased right knee edema, but no signs or symptoms of a DVT or any other adverse reactions following surgery. She was provided a HEP which she was able to properly demonstrate. She reported feeling comfortable with these interventions. Recommend that she continue with skilled physical therapy to address her remaining impairments to return to her prior level of function.    OBJECTIVE IMPAIRMENTS Abnormal gait, decreased activity tolerance, decreased balance, decreased mobility, difficulty walking, decreased ROM, decreased strength, hypomobility, increased edema, impaired flexibility, and pain.   ACTIVITY LIMITATIONS carrying, lifting, bending, standing, squatting, sleeping, stairs, transfers, bed mobility, dressing, and locomotion level  PARTICIPATION LIMITATIONS:  cleaning, driving, shopping, and community activity  PERSONAL FACTORS 1-2 comorbidities: HTN and unstable angina  are also affecting patient's functional outcome.   REHAB POTENTIAL: Good  CLINICAL DECISION MAKING: Stable/uncomplicated  EVALUATION COMPLEXITY: Low   GOALS: Goals reviewed with patient? No  SHORT TERM GOALS: Target date: 12/02/2021  Patient will be independent with her initial HEP.  Baseline: Goal status: INITIAL  2.  Patient will be able to demonstrate at least 90 degrees of active right knee flexion.  Baseline:  Goal status: INITIAL  3.  Patient will be able to safely ambulate without an assistive device.  Baseline:  Goal status: INITIAL  LONG TERM GOALS: Target date: 12/16/2021   Patient will be independent with her advanced HEP.  Baseline:  Goal status: INITIAL  2.  Patient will be able to achieve active right knee extension within 5 degrees of neutral for improved gait mechanics.  Baseline:  Goal status: INITIAL  3.  Patient will be able to demonstrate at least 120 degrees of active right knee flexion for improved function navigating stairs. Baseline:  Goal status: INITIAL  4.  Patient will be able to ambulate with no significant gait deviations.  Baseline:  Goal status: INITIAL   PLAN: PT FREQUENCY: 3x/week  PT DURATION: other: 5 weeks  PLANNED INTERVENTIONS: Therapeutic exercises, Therapeutic activity, Neuromuscular re-education, Balance training, Gait training, Patient/Family education, Self Care, Joint mobilization, Stair training, Electrical stimulation, Cryotherapy, Moist heat, Taping, Vasopneumatic device, Manual therapy, and Re-evaluation  PLAN FOR NEXT SESSION: nustep, review HEP, and modalities as needed   Granville Lewis, PT 11/12/2021, 9:42 AM

## 2021-11-15 ENCOUNTER — Ambulatory Visit: Payer: Medicare Other

## 2021-11-15 DIAGNOSIS — M25561 Pain in right knee: Secondary | ICD-10-CM | POA: Diagnosis not present

## 2021-11-15 DIAGNOSIS — M25661 Stiffness of right knee, not elsewhere classified: Secondary | ICD-10-CM

## 2021-11-15 NOTE — Therapy (Signed)
OUTPATIENT PHYSICAL THERAPY LOWER EXTREMITY TREATMENT   Patient Name: Alyssa Burns MRN: [id number] DOB:1950/05/11, 71 y.o., female Today's Date: 11/15/2021   PT End of Session - 11/15/21 1258     Visit Number 2    Number of Visits 15    Date for PT Re-Evaluation 01/21/22    PT Start Time 1258    PT Stop Time 1356    PT Time Calculation (min) 58 min    Activity Tolerance Patient tolerated treatment well    Behavior During Therapy [facility] for tasks assessed/performed              Past Medical History:  Diagnosis Date   CAD (coronary artery disease)    a. LHC 09/14/18 90% LAD-DES, 90% OM-PTCA   History of kidney stones    Sleep apnea    Past Surgical History:  Procedure Laterality Date   CORONARY BALLOON ANGIOPLASTY N/A 09/14/2018   Procedure: CORONARY BALLOON ANGIOPLASTY;  Surgeon: Kathleene Hazel, MD;  [facility];  Service: Cardiovascular;  Laterality: N/A;  diag   CORONARY STENT INTERVENTION N/A 09/14/2018   Procedure: CORONARY STENT INTERVENTION;  Surgeon: Kathleene Hazel, MD;  [facility];  Service: Cardiovascular;  Laterality: N/A;   EYE SURGERY Bilateral    blepharoplasty   JOINT REPLACEMENT Left 01/2002   knee   LEFT HEART CATH AND CORONARY ANGIOGRAPHY N/A 09/14/2018   Procedure: LEFT HEART CATH AND CORONARY ANGIOGRAPHY;  Surgeon: Dolores Patty, MD;  [facility];  Service: Cardiovascular;  Laterality: N/A;   ORIF ANKLE FRACTURE Right 08/03/2018   Procedure: OPEN REDUCTION INTERNAL FIXATION (ORIF) BIMALLEOLAR RIGHT ANKLE FRACTURE;  Surgeon: Bjorn Pippin, MD;  Location: [facility];  Service: Orthopedics;  Laterality: Right;   TONSILLECTOMY AND ADENOIDECTOMY     TOTAL KNEE ARTHROPLASTY Right 11/09/2021   Procedure: TOTAL KNEE ARTHROPLASTY;  Surgeon: Sheral Apley, MD;  Location: [facility];  Service: Orthopedics;  Laterality: Right;   TUBAL LIGATION     Patient Active Problem  List   Diagnosis Date Noted   Preop cardiovascular exam 09/21/2021   Dyslipidemia 06/23/2020   OSA on CPAP 06/23/2020   Essential hypertension 06/23/2020   Coronary artery disease 06/23/2020   Unstable angina (HCC) 09/14/2018   Elevated BP without diagnosis of hypertension 06/10/2016    PCP: Rebekah Chesterfield, NP  REFERRING PROVIDER: Sheral Apley, MD  REFERRING DIAG: Right Total Knee Replacement-DOS 11/09/21  THERAPY DIAG:  Acute pain of right knee  Stiffness of right knee, not elsewhere classified  Rationale for Evaluation and Treatment Rehabilitation  ONSET DATE: 11/09/21  SUBJECTIVE:   SUBJECTIVE STATEMENT: Patient reports that her knee is better today than it was at her evaluation. However, it still hurts when she tries to bend her knee.   PERTINENT HISTORY: History of right ankle fracture, angina, HTN  PAIN:  Are you having pain? Yes: NPRS scale: 4/10 Pain location: right thigh and knee Pain description: sore, burning, throbbing, aching Aggravating factors: moving, walking Relieving factors: ice  PRECAUTIONS: None  WEIGHT BEARING RESTRICTIONS No  FALLS:  Has patient fallen in last 6 months? No  LIVING ENVIRONMENT: Lives with: lives with their family Lives in: House/apartment Stairs: No Has following equipment at home: Ramped entry  OCCUPATION: retired  PLOF: Independent  PATIENT GOALS be able to travel to [place] (October 17-18), walk without a walker, return to the gym   OBJECTIVE: all objective measures were performed on  11/11/21 unless otherwise noted  PATIENT SURVEYS:  FOTO 23.31  COGNITION:  Overall cognitive status: Within functional limits for tasks assessed     SENSATION: WFL  PALPATION: TTP: right knee  LOWER EXTREMITY ROM:  Active ROM Right eval Right PROM Left eval  Hip flexion     Hip extension     Hip abduction     Hip adduction     Hip internal rotation     Hip external rotation     Knee flexion 26 41 126   Knee extension 11  0  Ankle dorsiflexion     Ankle plantarflexion     Ankle inversion     Ankle eversion      (Blank rows = not tested)  GAIT: Assistive device utilized: Environmental consultant - 2 wheeled Level of assistance: Modified independence Comments: step to pattern  TODAY'S TREATMENT:                                   8/21 EXERCISE LOG  Exercise Repetitions and Resistance Comments  Nustep L3 x 19 minutes; seat 9-8   Rocker board 5 minutes   Lunges onto step 6" step; 3 minutes   Standing hamstring stretch 4 x 30 seconds   SLR 15 reps   Heel slides 20 reps    Blank cell = exercise not performed today  Modalities  Date:  Vaso: Knee, 34 degrees, 15 mins, Pain and Edema                                   8/17 EXERCISE LOG  Exercise Repetitions and Resistance Comments  Quad sets 5 second hold x 10 reps   Gastroc stretch  4 x 30 seconds   Heel slides 10 reps            Blank cell = exercise not performed today  Modalities  Date:  Vaso: Knee, 34 degrees, 15 mins, Pain and Edema   PATIENT EDUCATION:  Education details: HEP, POC, prognosis, healing Person educated: Patient Education method: Explanation Education comprehension: verbalized understanding   HOME EXERCISE PROGRAM: [id number]  ASSESSMENT:  CLINICAL IMPRESSION: Patient was introduced to multiple new interventions for improved knee mobility needed for improved gait mechanics. She required minimal cueing with today's new interventions for proper exercise performance to facilitate improved knee mobility. She reported feeling tired and sore upon the conclusion of treatment. She continues to require skilled physical therapy to address her remaining impairments to return to her prior level of function.    OBJECTIVE IMPAIRMENTS Abnormal gait, decreased activity tolerance, decreased balance, decreased mobility, difficulty walking, decreased ROM, decreased strength, hypomobility, increased edema, impaired flexibility, and  pain.   ACTIVITY LIMITATIONS carrying, lifting, bending, standing, squatting, sleeping, stairs, transfers, bed mobility, dressing, and locomotion level  PARTICIPATION LIMITATIONS: cleaning, driving, shopping, and community activity  PERSONAL FACTORS 1-2 comorbidities: HTN and unstable angina  are also affecting patient's functional outcome.   REHAB POTENTIAL: Good  CLINICAL DECISION MAKING: Stable/uncomplicated  EVALUATION COMPLEXITY: Low   GOALS: Goals reviewed with patient? No  SHORT TERM GOALS: Target date: 12/02/2021  Patient will be independent with her initial HEP.  Baseline: Goal status: INITIAL  2.  Patient will be able to demonstrate at least 90 degrees of active right knee flexion.  Baseline:  Goal status: INITIAL  3.  Patient will be able to  safely ambulate without an assistive device.  Baseline:  Goal status: INITIAL  LONG TERM GOALS: Target date: 12/16/2021   Patient will be independent with her advanced HEP.  Baseline:  Goal status: INITIAL  2.  Patient will be able to achieve active right knee extension within 5 degrees of neutral for improved gait mechanics.  Baseline:  Goal status: INITIAL  3.  Patient will be able to demonstrate at least 120 degrees of active right knee flexion for improved function navigating stairs. Baseline:  Goal status: INITIAL  4.  Patient will be able to ambulate with no significant gait deviations.  Baseline:  Goal status: INITIAL   PLAN: PT FREQUENCY: 3x/week  PT DURATION: other: 5 weeks  PLANNED INTERVENTIONS: Therapeutic exercises, Therapeutic activity, Neuromuscular re-education, Balance training, Gait training, Patient/Family education, Self Care, Joint mobilization, Stair training, Electrical stimulation, Cryotherapy, Moist heat, Taping, Vasopneumatic device, Manual therapy, and Re-evaluation  PLAN FOR NEXT SESSION: nustep, review HEP, and modalities as needed   Granville Lewis, PT 11/15/2021, 1:59 PM

## 2021-11-17 ENCOUNTER — Encounter: Payer: Self-pay | Admitting: *Deleted

## 2021-11-17 ENCOUNTER — Ambulatory Visit: Payer: Medicare Other | Admitting: *Deleted

## 2021-11-17 DIAGNOSIS — M25561 Pain in right knee: Secondary | ICD-10-CM

## 2021-11-17 DIAGNOSIS — M25661 Stiffness of right knee, not elsewhere classified: Secondary | ICD-10-CM

## 2021-11-17 NOTE — Therapy (Signed)
OUTPATIENT PHYSICAL THERAPY LOWER EXTREMITY TREATMENT   Patient Name: Alyssa Burns MRN: [id number] DOB:08-07-1950, 71 y.o., female Today's Date: 11/17/2021   PT End of Session - 11/17/21 1040     Visit Number 3    Number of Visits 15    Date for PT Re-Evaluation 01/21/22    PT Start Time 1034    PT Stop Time 1130    PT Time Calculation (min) 56 min              Past Medical History:  Diagnosis Date   CAD (coronary artery disease)    a. LHC 09/14/18 90% LAD-DES, 90% OM-PTCA   History of kidney stones    Sleep apnea    Past Surgical History:  Procedure Laterality Date   CORONARY BALLOON ANGIOPLASTY N/A 09/14/2018   Procedure: CORONARY BALLOON ANGIOPLASTY;  Surgeon: Kathleene Hazel, MD;  [facility];  Service: Cardiovascular;  Laterality: N/A;  diag   CORONARY STENT INTERVENTION N/A 09/14/2018   Procedure: CORONARY STENT INTERVENTION;  Surgeon: Kathleene Hazel, MD;  [facility];  Service: Cardiovascular;  Laterality: N/A;   EYE SURGERY Bilateral    blepharoplasty   JOINT REPLACEMENT Left 01/2002   knee   LEFT HEART CATH AND CORONARY ANGIOGRAPHY N/A 09/14/2018   Procedure: LEFT HEART CATH AND CORONARY ANGIOGRAPHY;  Surgeon: Dolores Patty, MD;  [facility];  Service: Cardiovascular;  Laterality: N/A;   ORIF ANKLE FRACTURE Right 08/03/2018   Procedure: OPEN REDUCTION INTERNAL FIXATION (ORIF) BIMALLEOLAR RIGHT ANKLE FRACTURE;  Surgeon: Bjorn Pippin, MD;  Location: [facility];  Service: Orthopedics;  Laterality: Right;   TONSILLECTOMY AND ADENOIDECTOMY     TOTAL KNEE ARTHROPLASTY Right 11/09/2021   Procedure: TOTAL KNEE ARTHROPLASTY;  Surgeon: Sheral Apley, MD;  Location: [facility];  Service: Orthopedics;  Laterality: Right;   TUBAL LIGATION     Patient Active Problem List   Diagnosis Date Noted   Preop cardiovascular exam 09/21/2021   Dyslipidemia 06/23/2020   OSA on CPAP  06/23/2020   Essential hypertension 06/23/2020   Coronary artery disease 06/23/2020   Unstable angina (HCC) 09/14/2018   Elevated BP without diagnosis of hypertension 06/10/2016    PCP: Rebekah Chesterfield, NP  REFERRING PROVIDER: Sheral Apley, MD  REFERRING DIAG: Right Total Knee Replacement-DOS 11/09/21  THERAPY DIAG:  Acute pain of right knee  Stiffness of right knee, not elsewhere classified  Rationale for Evaluation and Treatment Rehabilitation  ONSET DATE: 11/09/21  SUBJECTIVE:   SUBJECTIVE STATEMENT: Patient reports doing okay after last Rx  PERTINENT HISTORY: History of right ankle fracture, angina, HTN  PAIN:  Are you having pain? Yes: NPRS scale: 4/10 Pain location: right thigh and knee Pain description: sore, burning, throbbing, aching Aggravating factors: moving, walking Relieving factors: ice  PRECAUTIONS: None  WEIGHT BEARING RESTRICTIONS No  FALLS:  Has patient fallen in last 6 months? No  LIVING ENVIRONMENT: Lives with: lives with their family Lives in: House/apartment Stairs: No Has following equipment at home: Ramped entry  OCCUPATION: retired  PLOF: Independent  PATIENT GOALS be able to travel to [place] (October 17-18), walk without a walker, return to the gym   OBJECTIVE: all objective measures were performed on 11/11/21 unless otherwise noted  PATIENT SURVEYS:  FOTO 23.31  COGNITION:  Overall cognitive status: Within functional limits for tasks assessed     SENSATION: WFL  PALPATION: TTP: right knee  LOWER EXTREMITY ROM:  Active ROM Right eval Right PROM Left eval  Hip flexion     Hip extension     Hip abduction     Hip adduction     Hip internal rotation     Hip external rotation     Knee flexion 26 41 126  Knee extension 11  0  Ankle dorsiflexion     Ankle plantarflexion     Ankle inversion     Ankle eversion      (Blank rows = not tested)  GAIT: Assistive device utilized: Environmental consultant - 2  wheeled Level of assistance: Modified independence Comments: step to pattern  TODAY'S TREATMENT:                                   8/23 EXERCISE LOG  Exercise Repetitions and Resistance Comments  Nustep L3 x 20 minutes; seat 9-8-7   Rocker board 5 minutes   Lunges onto step 8in box x20   Standing hamstring stretch    SLR    Heel slides    LAQs X 10 pause at top        Blank cell = exercise not performed today  MANUAL: PROM to RT knee for knee flexion progression in sitting with Pt performing active HS curl. Massage to quads   Modalities  Date:  Vaso: Knee, 34 degrees, 10 mins, Pain and Edema                                   8/17 EXERCISE LOG  Exercise Repetitions and Resistance Comments  Quad sets 5 second hold x 10 reps   Gastroc stretch  4 x 30 seconds   Heel slides 10 reps            Blank cell = exercise not performed today  Modalities  Date:  Vaso: Knee, 34 degrees, 15 mins, Pain and Edema   PATIENT EDUCATION:  Education details: HEP, POC, prognosis, healing Person educated: Patient Education method: Explanation Education comprehension: verbalized understanding   HOME EXERCISE PROGRAM: [id number]  ASSESSMENT:  CLINICAL IMPRESSION: Patient arrived today doing fair with RT TKR.Rx focused on progressing ROM through therex and manual PROM as well as Quad activation. Vaso end of session for edema control  OBJECTIVE IMPAIRMENTS Abnormal gait, decreased activity tolerance, decreased balance, decreased mobility, difficulty walking, decreased ROM, decreased strength, hypomobility, increased edema, impaired flexibility, and pain.   ACTIVITY LIMITATIONS carrying, lifting, bending, standing, squatting, sleeping, stairs, transfers, bed mobility, dressing, and locomotion level  PARTICIPATION LIMITATIONS: cleaning, driving, shopping, and community activity  PERSONAL FACTORS 1-2 comorbidities: HTN and unstable angina  are also affecting patient's functional outcome.    REHAB POTENTIAL: Good  CLINICAL DECISION MAKING: Stable/uncomplicated  EVALUATION COMPLEXITY: Low   GOALS: Goals reviewed with patient? No  SHORT TERM GOALS: Target date: 12/02/2021  Patient will be independent with her initial HEP.  Baseline: Goal status: INITIAL  2.  Patient will be able to demonstrate at least 90 degrees of active right knee flexion.  Baseline:  Goal status: INITIAL  3.  Patient will be able to safely ambulate without an assistive device.  Baseline:  Goal status: INITIAL  LONG TERM GOALS: Target date: 12/16/2021   Patient will be independent with her advanced HEP.  Baseline:  Goal status: INITIAL  2.  Patient will be able to achieve active right knee  extension within 5 degrees of neutral for improved gait mechanics.  Baseline:  Goal status: INITIAL  3.  Patient will be able to demonstrate at least 120 degrees of active right knee flexion for improved function navigating stairs. Baseline:  Goal status: INITIAL  4.  Patient will be able to ambulate with no significant gait deviations.  Baseline:  Goal status: INITIAL   PLAN: PT FREQUENCY: 3x/week  PT DURATION: other: 5 weeks  PLANNED INTERVENTIONS: Therapeutic exercises, Therapeutic activity, Neuromuscular re-education, Balance training, Gait training, Patient/Family education, Self Care, Joint mobilization, Stair training, Electrical stimulation, Cryotherapy, Moist heat, Taping, Vasopneumatic device, Manual therapy, and Re-evaluation  PLAN FOR NEXT SESSION: nustep, review HEP, and modalities as needed   Aleah Ahlgrim,CHRIS, PTA 11/17/2021, 11:42 AM

## 2021-11-19 ENCOUNTER — Ambulatory Visit: Payer: Medicare Other

## 2021-11-19 DIAGNOSIS — M25661 Stiffness of right knee, not elsewhere classified: Secondary | ICD-10-CM

## 2021-11-19 DIAGNOSIS — M25561 Pain in right knee: Secondary | ICD-10-CM | POA: Diagnosis not present

## 2021-11-19 NOTE — Therapy (Signed)
OUTPATIENT PHYSICAL THERAPY LOWER EXTREMITY TREATMENT   Patient Name: Alyssa Burns MRN: [id number] DOB:12/27/1950, 71 y.o., female Today's Date: 11/19/2021   PT End of Session - 11/19/21 1037     Visit Number 4    Number of Visits 15    Date for PT Re-Evaluation 01/21/22    PT Start Time 1030    PT Stop Time 1130    PT Time Calculation (min) 60 min    Activity Tolerance Patient tolerated treatment well    Behavior During Therapy WFL for tasks assessed/performed               Past Medical History:  Diagnosis Date   CAD (coronary artery disease)    a. LHC 09/14/18 90% LAD-DES, 90% OM-PTCA   History of kidney stones    Sleep apnea    Past Surgical History:  Procedure Laterality Date   CORONARY BALLOON ANGIOPLASTY N/A 09/14/2018   Procedure: CORONARY BALLOON ANGIOPLASTY;  Surgeon: Kathleene Hazel, MD;  Location: [facility];  Service: Cardiovascular;  Laterality: N/A;  diag   CORONARY STENT INTERVENTION N/A 09/14/2018   Procedure: CORONARY STENT INTERVENTION;  Surgeon: Kathleene Hazel, MD;  Location: [facility];  Service: Cardiovascular;  Laterality: N/A;   EYE SURGERY Bilateral    blepharoplasty   JOINT REPLACEMENT Left 01/2002   knee   LEFT HEART CATH AND CORONARY ANGIOGRAPHY N/A 09/14/2018   Procedure: LEFT HEART CATH AND CORONARY ANGIOGRAPHY;  Surgeon: Dolores Patty, MD;  Location: [facility];  Service: Cardiovascular;  Laterality: N/A;   ORIF ANKLE FRACTURE Right 08/03/2018   Procedure: OPEN REDUCTION INTERNAL FIXATION (ORIF) BIMALLEOLAR RIGHT ANKLE FRACTURE;  Surgeon: Bjorn Pippin, MD;  Location: [facility];  Service: Orthopedics;  Laterality: Right;   TONSILLECTOMY AND ADENOIDECTOMY     TOTAL KNEE ARTHROPLASTY Right 11/09/2021   Procedure: TOTAL KNEE ARTHROPLASTY;  Surgeon: Sheral Apley, MD;  Location: [facility];  Service: Orthopedics;  Laterality: Right;   TUBAL LIGATION     Patient Active  Problem List   Diagnosis Date Noted   Preop cardiovascular exam 09/21/2021   Dyslipidemia 06/23/2020   OSA on CPAP 06/23/2020   Essential hypertension 06/23/2020   Coronary artery disease 06/23/2020   Unstable angina (HCC) 09/14/2018   Elevated BP without diagnosis of hypertension 06/10/2016    PCP: Rebekah Chesterfield, NP  REFERRING PROVIDER: Sheral Apley, MD  REFERRING DIAG: Right Total Knee Replacement-DOS 11/09/21  THERAPY DIAG:  Acute pain of right knee  Stiffness of right knee, not elsewhere classified  Rationale for Evaluation and Treatment Rehabilitation  ONSET DATE: 11/09/21  SUBJECTIVE:   SUBJECTIVE STATEMENT: Patient reported that her knee was hurting a little more this morning as it was a 6/10, but her pain medication is helping.   PERTINENT HISTORY: History of right ankle fracture, angina, HTN  PAIN:  Are you having pain? Yes: NPRS scale: 3/10 Pain location: right thigh and knee Pain description: sore, burning, throbbing, aching Aggravating factors: moving, walking Relieving factors: ice  PRECAUTIONS: None  WEIGHT BEARING RESTRICTIONS No  FALLS:  Has patient fallen in last 6 months? No  LIVING ENVIRONMENT: Lives with: lives with their family Lives in: House/apartment Stairs: No Has following equipment at home: Ramped entry  OCCUPATION: retired  PLOF: Independent  PATIENT GOALS be able to travel to [place] (October 17-18), walk without a walker, return to the gym   OBJECTIVE: all objective measures were performed on 11/11/21  unless otherwise noted  PATIENT SURVEYS:  FOTO 23.31  COGNITION:  Overall cognitive status: Within functional limits for tasks assessed     SENSATION: WFL  PALPATION: TTP: right knee  LOWER EXTREMITY ROM:  Active ROM Right eval Right PROM Left eval  Hip flexion     Hip extension     Hip abduction     Hip adduction     Hip internal rotation     Hip external rotation     Knee flexion 26 41 126   Knee extension 11  0  Ankle dorsiflexion     Ankle plantarflexion     Ankle inversion     Ankle eversion      (Blank rows = not tested)  GAIT: Assistive device utilized: Environmental consultant - 2 wheeled Level of assistance: Modified independence Comments: step to pattern  TODAY'S TREATMENT:                                   8/25 EXERCISE LOG  Exercise Repetitions and Resistance Comments  Nustep L3 x 21 minutes; seat 9-8   Rocker board 5 minutes   Hamstring stretch  4 x 30 seconds   Lunges onto step 6" step; 2 minutes   Marching on foam 2 minutes   SLR  20 reps     Blank cell = exercise not performed today  Modalities  Date:  Vaso: Knee, 34 degrees; low pressure, 15 mins, Pain and Edema                                   8/23 EXERCISE LOG  Exercise Repetitions and Resistance Comments  Nustep L3 x 20 minutes; seat 9-8-7   Rocker board 5 minutes   Lunges onto step 8in box x20   Standing hamstring stretch    SLR    Heel slides    LAQs X 10 pause at top        Blank cell = exercise not performed today  MANUAL: PROM to RT knee for knee flexion progression in sitting with Pt performing active HS curl. Massage to quads   Modalities  Date:  Vaso: Knee, 34 degrees, 10 mins, Pain and Edema                                   8/17 EXERCISE LOG  Exercise Repetitions and Resistance Comments  Quad sets 5 second hold x 10 reps   Gastroc stretch  4 x 30 seconds   Heel slides 10 reps            Blank cell = exercise not performed today  Modalities  Date:  Vaso: Knee, 34 degrees, 15 mins, Pain and Edema   PATIENT EDUCATION:  Education details: HEP, POC, prognosis, healing Person educated: Patient Education method: Explanation Education comprehension: verbalized understanding   HOME EXERCISE PROGRAM: [id number]  ASSESSMENT:  CLINICAL IMPRESSION: Treatment focused on familiar interventions for improved knee mobility. She required minimal cueing with today's interventions for  proper exercise performance. She experienced no significant increase in pain or discomfort with any of today's interventions. She reported that her knee felt better upon the conclusion of treatment. She continues to require skilled physical therapy to address her remaining impairments to return to her prior level of function.  OBJECTIVE IMPAIRMENTS Abnormal gait, decreased activity tolerance, decreased balance, decreased mobility, difficulty walking, decreased ROM, decreased strength, hypomobility, increased edema, impaired flexibility, and pain.   ACTIVITY LIMITATIONS carrying, lifting, bending, standing, squatting, sleeping, stairs, transfers, bed mobility, dressing, and locomotion level  PARTICIPATION LIMITATIONS: cleaning, driving, shopping, and community activity  PERSONAL FACTORS 1-2 comorbidities: HTN and unstable angina  are also affecting patient's functional outcome.   REHAB POTENTIAL: Good  CLINICAL DECISION MAKING: Stable/uncomplicated  EVALUATION COMPLEXITY: Low   GOALS: Goals reviewed with patient? No  SHORT TERM GOALS: Target date: 12/02/2021  Patient will be independent with her initial HEP.  Baseline: Goal status: INITIAL  2.  Patient will be able to demonstrate at least 90 degrees of active right knee flexion.  Baseline:  Goal status: INITIAL  3.  Patient will be able to safely ambulate without an assistive device.  Baseline:  Goal status: INITIAL  LONG TERM GOALS: Target date: 12/16/2021   Patient will be independent with her advanced HEP.  Baseline:  Goal status: INITIAL  2.  Patient will be able to achieve active right knee extension within 5 degrees of neutral for improved gait mechanics.  Baseline:  Goal status: INITIAL  3.  Patient will be able to demonstrate at least 120 degrees of active right knee flexion for improved function navigating stairs. Baseline:  Goal status: INITIAL  4.  Patient will be able to ambulate with no significant gait  deviations.  Baseline:  Goal status: INITIAL   PLAN: PT FREQUENCY: 3x/week  PT DURATION: other: 5 weeks  PLANNED INTERVENTIONS: Therapeutic exercises, Therapeutic activity, Neuromuscular re-education, Balance training, Gait training, Patient/Family education, Self Care, Joint mobilization, Stair training, Electrical stimulation, Cryotherapy, Moist heat, Taping, Vasopneumatic device, Manual therapy, and Re-evaluation  PLAN FOR NEXT SESSION: nustep, review HEP, and modalities as needed   Granville Lewis, PT 11/19/2021, 12:22 PM

## 2021-11-22 ENCOUNTER — Ambulatory Visit: Payer: Medicare Other

## 2021-11-22 DIAGNOSIS — M25661 Stiffness of right knee, not elsewhere classified: Secondary | ICD-10-CM

## 2021-11-22 DIAGNOSIS — M25561 Pain in right knee: Secondary | ICD-10-CM

## 2021-11-22 NOTE — Therapy (Signed)
OUTPATIENT PHYSICAL THERAPY LOWER EXTREMITY TREATMENT   Patient Name: Alyssa Burns MRN: [id number] DOB:12-25-1950, 71 y.o., female Today's Date: 11/22/2021   PT End of Session - 11/22/21 1030     Visit Number 5    Number of Visits 15    Date for PT Re-Evaluation 01/21/22    PT Start Time 1029    PT Stop Time 1125    PT Time Calculation (min) 56 min    Activity Tolerance Patient tolerated treatment well    Behavior During Therapy WFL for tasks assessed/performed                Past Medical History:  Diagnosis Date   CAD (coronary artery disease)    a. LHC 09/14/18 90% LAD-DES, 90% OM-PTCA   History of kidney stones    Sleep apnea    Past Surgical History:  Procedure Laterality Date   CORONARY BALLOON ANGIOPLASTY N/A 09/14/2018   Procedure: CORONARY BALLOON ANGIOPLASTY;  Surgeon: Kathleene Hazel, MD;  Location: [facility];  Service: Cardiovascular;  Laterality: N/A;  diag   CORONARY STENT INTERVENTION N/A 09/14/2018   Procedure: CORONARY STENT INTERVENTION;  Surgeon: Kathleene Hazel, MD;  Location: [facility];  Service: Cardiovascular;  Laterality: N/A;   EYE SURGERY Bilateral    blepharoplasty   JOINT REPLACEMENT Left 01/2002   knee   LEFT HEART CATH AND CORONARY ANGIOGRAPHY N/A 09/14/2018   Procedure: LEFT HEART CATH AND CORONARY ANGIOGRAPHY;  Surgeon: Dolores Patty, MD;  Location: [facility];  Service: Cardiovascular;  Laterality: N/A;   ORIF ANKLE FRACTURE Right 08/03/2018   Procedure: OPEN REDUCTION INTERNAL FIXATION (ORIF) BIMALLEOLAR RIGHT ANKLE FRACTURE;  Surgeon: Bjorn Pippin, MD;  Location: [facility];  Service: Orthopedics;  Laterality: Right;   TONSILLECTOMY AND ADENOIDECTOMY     TOTAL KNEE ARTHROPLASTY Right 11/09/2021   Procedure: TOTAL KNEE ARTHROPLASTY;  Surgeon: Sheral Apley, MD;  Location: [facility];  Service: Orthopedics;  Laterality: Right;   TUBAL LIGATION     Patient Active  Problem List   Diagnosis Date Noted   Preop cardiovascular exam 09/21/2021   Dyslipidemia 06/23/2020   OSA on CPAP 06/23/2020   Essential hypertension 06/23/2020   Coronary artery disease 06/23/2020   Unstable angina (HCC) 09/14/2018   Elevated BP without diagnosis of hypertension 06/10/2016    PCP: Rebekah Chesterfield, NP  REFERRING PROVIDER: Sheral Apley, MD  REFERRING DIAG: Right Total Knee Replacement-DOS 11/09/21  THERAPY DIAG:  Acute pain of right knee  Stiffness of right knee, not elsewhere classified  Rationale for Evaluation and Treatment Rehabilitation  ONSET DATE: 11/09/21  SUBJECTIVE:   SUBJECTIVE STATEMENT: Patient reports that she did not have any problems since her last appointment  PERTINENT HISTORY: History of right ankle fracture, angina, HTN  PAIN:  Are you having pain? Yes: NPRS scale: 3/10 Pain location: right thigh and knee Pain description: sore, burning, throbbing, aching Aggravating factors: moving, walking Relieving factors: ice  PRECAUTIONS: None  WEIGHT BEARING RESTRICTIONS No  FALLS:  Has patient fallen in last 6 months? No  LIVING ENVIRONMENT: Lives with: lives with their family Lives in: House/apartment Stairs: No Has following equipment at home: Ramped entry  OCCUPATION: retired  PLOF: Independent  PATIENT GOALS be able to travel to [place] (October 17-18), walk without a walker, return to the gym   OBJECTIVE: all objective measures were performed on 11/11/21 unless otherwise noted  PATIENT SURVEYS:  FOTO 23.31  COGNITION:  Overall cognitive status: Within functional limits for tasks assessed     SENSATION: WFL  PALPATION: TTP: right knee  LOWER EXTREMITY ROM:  Active ROM Right eval Right 8/28 AROM Right PROM eval Left eval  Hip flexion      Hip extension      Hip abduction      Hip adduction      Hip internal rotation      Hip external rotation      Knee flexion 26 81 41 126  Knee extension  11 13  0  Ankle dorsiflexion      Ankle plantarflexion      Ankle inversion      Ankle eversion       (Blank rows = not tested)  GAIT: Assistive device utilized: Environmental consultant - 2 wheeled Level of assistance: Modified independence Comments: step to pattern  TODAY'S TREATMENT:                                   8/28 EXERCISE LOG  Exercise Repetitions and Resistance Comments  Nustep  L3 x 18 minutes; seat 7-6   Sit to stand  10 reps from lowered mat table   Step up  6" step; 2 minutes RLE leading; cueing to prevent circumduction  Standing hamstring stretch 3 x 30 seconds   Recumbent bike 4 minutes Rocking in available ROM  SLR  20 reps     Blank cell = exercise not performed today  Modalities  Date:  Vaso: Knee, 34 degrees; low pressure, 15 mins, Pain                                   8/25 EXERCISE LOG  Exercise Repetitions and Resistance Comments  Nustep L3 x 21 minutes; seat 9-8   Rocker board 5 minutes   Hamstring stretch  4 x 30 seconds   Lunges onto step 6" step; 2 minutes   Marching on foam 2 minutes   SLR  20 reps     Blank cell = exercise not performed today  Modalities  Date:  Vaso: Knee, 34 degrees; low pressure, 15 mins, Pain and Edema                                   8/23 EXERCISE LOG  Exercise Repetitions and Resistance Comments  Nustep L3 x 20 minutes; seat 9-8-7   Rocker board 5 minutes   Lunges onto step 8in box x20   Standing hamstring stretch    SLR    Heel slides    LAQs X 10 pause at top        Blank cell = exercise not performed today  MANUAL: PROM to RT knee for knee flexion progression in sitting with Pt performing active HS curl. Massage to quads   Modalities  Date:  Vaso: Knee, 34 degrees, 10 mins, Pain and Edema   PATIENT EDUCATION:  Education details: HEP, POC, prognosis, healing Person educated: Patient Education method: Explanation Education comprehension: verbalized understanding   HOME EXERCISE  PROGRAM: [id number]  ASSESSMENT:  CLINICAL IMPRESSION: Patient was progressed with multiple new interventions for improved knee mobility. She required minimal cueing with straight leg raises to facilitate quadriceps engagement to maintain knee extension. She experienced a mild increase in  her familiar knee pain with the recumbent bike. However, this did not limit her ability to complete today's interventions. Modalities were able to slightly reduce her familiar symptoms. Recommend that she continue with skilled physical therapy to address her remaining impairments to return to her prior level of function.   OBJECTIVE IMPAIRMENTS Abnormal gait, decreased activity tolerance, decreased balance, decreased mobility, difficulty walking, decreased ROM, decreased strength, hypomobility, increased edema, impaired flexibility, and pain.   ACTIVITY LIMITATIONS carrying, lifting, bending, standing, squatting, sleeping, stairs, transfers, bed mobility, dressing, and locomotion level  PARTICIPATION LIMITATIONS: cleaning, driving, shopping, and community activity  PERSONAL FACTORS 1-2 comorbidities: HTN and unstable angina  are also affecting patient's functional outcome.   REHAB POTENTIAL: Good  CLINICAL DECISION MAKING: Stable/uncomplicated  EVALUATION COMPLEXITY: Low   GOALS: Goals reviewed with patient? No  SHORT TERM GOALS: Target date: 12/02/2021  Patient will be independent with her initial HEP.  Baseline: Goal status: INITIAL  2.  Patient will be able to demonstrate at least 90 degrees of active right knee flexion.  Baseline:  Goal status: INITIAL  3.  Patient will be able to safely ambulate without an assistive device.  Baseline:  Goal status: INITIAL  LONG TERM GOALS: Target date: 12/16/2021   Patient will be independent with her advanced HEP.  Baseline:  Goal status: INITIAL  2.  Patient will be able to achieve active right knee extension within 5 degrees of neutral for improved  gait mechanics.  Baseline:  Goal status: INITIAL  3.  Patient will be able to demonstrate at least 120 degrees of active right knee flexion for improved function navigating stairs. Baseline:  Goal status: INITIAL  4.  Patient will be able to ambulate with no significant gait deviations.  Baseline:  Goal status: INITIAL   PLAN: PT FREQUENCY: 3x/week  PT DURATION: other: 5 weeks  PLANNED INTERVENTIONS: Therapeutic exercises, Therapeutic activity, Neuromuscular re-education, Balance training, Gait training, Patient/Family education, Self Care, Joint mobilization, Stair training, Electrical stimulation, Cryotherapy, Moist heat, Taping, Vasopneumatic device, Manual therapy, and Re-evaluation  PLAN FOR NEXT SESSION: nustep, review HEP, and modalities as needed   Granville Lewis, PT 11/22/2021, 1:23 PM

## 2021-11-24 ENCOUNTER — Ambulatory Visit: Payer: Medicare Other

## 2021-11-24 DIAGNOSIS — M25561 Pain in right knee: Secondary | ICD-10-CM | POA: Diagnosis not present

## 2021-11-24 DIAGNOSIS — M25661 Stiffness of right knee, not elsewhere classified: Secondary | ICD-10-CM

## 2021-11-24 NOTE — Therapy (Signed)
OUTPATIENT PHYSICAL THERAPY LOWER EXTREMITY TREATMENT   Patient Name: Alyssa Burns MRN: [id number] DOB:[birth year], 71 y.o., female Today's Date: 11/24/2021   PT End of Session - 11/24/21 1039     Visit Number 6    Number of Visits 15    Date for PT Re-Evaluation 01/21/22    PT Start Time 1031    PT Stop Time 1124    PT Time Calculation (min) 53 min    Activity Tolerance Patient tolerated treatment well    Behavior During Therapy WFL for tasks assessed/performed                 Past Medical History:  Diagnosis Date   CAD (coronary artery disease)    a. LHC 09/14/18 90% LAD-DES, 90% OM-PTCA   History of kidney stones    Sleep apnea    Past Surgical History:  Procedure Laterality Date   CORONARY BALLOON ANGIOPLASTY N/A 09/14/2018   Procedure: CORONARY BALLOON ANGIOPLASTY;  Surgeon: Burnell Blanks, MD;  Location: [facility];  Service: Cardiovascular;  Laterality: N/A;  diag   CORONARY STENT INTERVENTION N/A 09/14/2018   Procedure: CORONARY STENT INTERVENTION;  Surgeon: Burnell Blanks, MD;  Location: [facility];  Service: Cardiovascular;  Laterality: N/A;   EYE SURGERY Bilateral    blepharoplasty   JOINT REPLACEMENT Left 01/2002   knee   LEFT HEART CATH AND CORONARY ANGIOGRAPHY N/A 09/14/2018   Procedure: LEFT HEART CATH AND CORONARY ANGIOGRAPHY;  Surgeon: Jolaine Artist, MD;  Location: [facility];  Service: Cardiovascular;  Laterality: N/A;   ORIF ANKLE FRACTURE Right 08/03/2018   Procedure: OPEN REDUCTION INTERNAL FIXATION (ORIF) BIMALLEOLAR RIGHT ANKLE FRACTURE;  Surgeon: Hiram Gash, MD;  Location: [facility];  Service: Orthopedics;  Laterality: Right;   TONSILLECTOMY AND ADENOIDECTOMY     TOTAL KNEE ARTHROPLASTY Right 11/09/2021   Procedure: TOTAL KNEE ARTHROPLASTY;  Surgeon: Renette Butters, MD;  Location: [facility];  Service: Orthopedics;  Laterality: Right;   TUBAL LIGATION     Patient Active  Problem List   Diagnosis Date Noted   Preop cardiovascular exam 09/21/2021   Dyslipidemia 06/23/2020   OSA on CPAP 06/23/2020   Essential hypertension 06/23/2020   Coronary artery disease 06/23/2020   Unstable angina (HCC) 09/14/2018   Elevated BP without diagnosis of hypertension 06/10/2016    PCP: Adaline Sill, NP  REFERRING PROVIDER: Renette Butters, MD  REFERRING DIAG: Right Total Knee Replacement-DOS 11/09/21  THERAPY DIAG:  Acute pain of right knee  Stiffness of right knee, not elsewhere classified  Rationale for Evaluation and Treatment Rehabilitation  ONSET DATE: 11/09/21  SUBJECTIVE:   SUBJECTIVE STATEMENT: Patient reports that her knee feels "rough" today.   PERTINENT HISTORY: History of right ankle fracture, angina, HTN  PAIN:  Are you having pain? Yes: NPRS scale: 6/10 Pain location: right thigh and knee Pain description: sore, burning, throbbing, aching Aggravating factors: moving, walking Relieving factors: ice  PRECAUTIONS: None  WEIGHT BEARING RESTRICTIONS No  FALLS:  Has patient fallen in last 6 months? No  LIVING ENVIRONMENT: Lives with: lives with their family Lives in: House/apartment Stairs: No Has following equipment at home: Ramped entry  OCCUPATION: retired  PLOF: Independent  PATIENT GOALS be able to travel to [place] (October 17-18), walk without a walker, return to the gym   OBJECTIVE: all objective measures were performed on 11/11/21 unless otherwise noted  PATIENT SURVEYS:  FOTO 23.31  COGNITION:  Overall  cognitive status: Within functional limits for tasks assessed     SENSATION: WFL  PALPATION: TTP: right knee  LOWER EXTREMITY ROM:  Active ROM Right eval Right 8/28 AROM Right PROM eval Left eval  Hip flexion      Hip extension      Hip abduction      Hip adduction      Hip internal rotation      Hip external rotation      Knee flexion 26 81 41 126  Knee extension 11 13  0  Ankle  dorsiflexion      Ankle plantarflexion      Ankle inversion      Ankle eversion       (Blank rows = not tested)  GAIT: Assistive device utilized: Environmental consultant - 2 wheeled Level of assistance: Modified independence Comments: step to pattern  TODAY'S TREATMENT:                                   8/30 EXERCISE LOG  Exercise Repetitions and Resistance Comments  Nustep L3 x 20 minutes; seat 7   Rocker board  5 minutes   Marching on foam 3 minutes   Sit to stand  20 reps without UE support From lowered mat table  LAQ 20 reps   Quad set 5 second hold x 3 minutes   Heel slide  2 minutes    Blank cell = exercise not performed today  Modalities  Date:  Vaso: Knee, 34 degrees; low pressure, 10 mins, Pain and Edema                                   8/28 EXERCISE LOG  Exercise Repetitions and Resistance Comments  Nustep  L3 x 18 minutes; seat 7-6   Sit to stand  10 reps from lowered mat table   Step up  6" step; 2 minutes RLE leading; cueing to prevent circumduction  Standing hamstring stretch 3 x 30 seconds   Recumbent bike 4 minutes Rocking in available ROM  SLR  20 reps     Blank cell = exercise not performed today  Modalities  Date:  Vaso: Knee, 34 degrees; low pressure, 15 mins, Pain                                   8/25 EXERCISE LOG  Exercise Repetitions and Resistance Comments  Nustep L3 x 21 minutes; seat 9-8   Rocker board 5 minutes   Hamstring stretch  4 x 30 seconds   Lunges onto step 6" step; 2 minutes   Marching on foam 2 minutes   SLR  20 reps     Blank cell = exercise not performed today  Modalities  Date:  Vaso: Knee, 34 degrees; low pressure, 15 mins, Pain and Edema  PATIENT EDUCATION:  Education details: HEP, POC, prognosis, healing Person educated: Patient Education method: Explanation Education comprehension: verbalized understanding   HOME EXERCISE PROGRAM: [id number]  ASSESSMENT:  CLINICAL IMPRESSION: Treatment focused on familiar interventions  for improved knee mobility. She experienced a mild increase in soreness and discomfort with today's interventions. However, this did not prevent her from completing today's interventions. She reported that her knee felt a little better upon the conclusion of treatment. She continues to  require skilled physical therapy to address her remaining impairments to return to her prior level of function.   OBJECTIVE IMPAIRMENTS Abnormal gait, decreased activity tolerance, decreased balance, decreased mobility, difficulty walking, decreased ROM, decreased strength, hypomobility, increased edema, impaired flexibility, and pain.   ACTIVITY LIMITATIONS carrying, lifting, bending, standing, squatting, sleeping, stairs, transfers, bed mobility, dressing, and locomotion level  PARTICIPATION LIMITATIONS: cleaning, driving, shopping, and community activity  PERSONAL FACTORS 1-2 comorbidities: HTN and unstable angina  are also affecting patient's functional outcome.   REHAB POTENTIAL: Good  CLINICAL DECISION MAKING: Stable/uncomplicated  EVALUATION COMPLEXITY: Low   GOALS: Goals reviewed with patient? No  SHORT TERM GOALS: Target date: 12/02/2021  Patient will be independent with her initial HEP.  Baseline: Goal status: INITIAL  2.  Patient will be able to demonstrate at least 90 degrees of active right knee flexion.  Baseline:  Goal status: IN PROGRESS  3.  Patient will be able to safely ambulate without an assistive device.  Baseline:  Goal status: MET  LONG TERM GOALS: Target date: 12/16/2021   Patient will be independent with her advanced HEP.  Baseline:  Goal status: INITIAL  2.  Patient will be able to achieve active right knee extension within 5 degrees of neutral for improved gait mechanics.  Baseline:  Goal status: IN PROGRESS  3.  Patient will be able to demonstrate at least 120 degrees of active right knee flexion for improved function navigating stairs. Baseline:  Goal status: IN  PROGRESS  4.  Patient will be able to ambulate with no significant gait deviations.  Baseline:  Goal status: IN PROGRESS   PLAN: PT FREQUENCY: 3x/week  PT DURATION: other: 5 weeks  PLANNED INTERVENTIONS: Therapeutic exercises, Therapeutic activity, Neuromuscular re-education, Balance training, Gait training, Patient/Family education, Self Care, Joint mobilization, Stair training, Electrical stimulation, Cryotherapy, Moist heat, Taping, Vasopneumatic device, Manual therapy, and Re-evaluation  PLAN FOR NEXT SESSION: nustep, review HEP, and modalities as needed   Darlin Coco, PT 11/24/2021, 12:23 PM

## 2021-11-25 ENCOUNTER — Encounter: Payer: Self-pay | Admitting: *Deleted

## 2021-11-25 ENCOUNTER — Ambulatory Visit: Payer: Medicare Other | Admitting: *Deleted

## 2021-11-25 DIAGNOSIS — M25561 Pain in right knee: Secondary | ICD-10-CM | POA: Diagnosis not present

## 2021-11-25 DIAGNOSIS — M25661 Stiffness of right knee, not elsewhere classified: Secondary | ICD-10-CM

## 2021-11-25 NOTE — Therapy (Signed)
OUTPATIENT PHYSICAL THERAPY LOWER EXTREMITY TREATMENT   Patient Name: Alyssa Burns MRN: [id number] DOB:[birth year], 71 y.o., female Today's Date: 11/25/2021   PT End of Session - 11/25/21 1123     Visit Number 7    Number of Visits 15    Date for PT Re-Evaluation 01/21/22    PT Start Time 1030    PT Stop Time 1121    PT Time Calculation (min) 51 min                 Past Medical History:  Diagnosis Date   CAD (coronary artery disease)    a. LHC 09/14/18 90% LAD-DES, 90% OM-PTCA   History of kidney stones    Sleep apnea    Past Surgical History:  Procedure Laterality Date   CORONARY BALLOON ANGIOPLASTY N/A 09/14/2018   Procedure: CORONARY BALLOON ANGIOPLASTY;  Surgeon: Burnell Blanks, MD;  Location: [facility];  Service: Cardiovascular;  Laterality: N/A;  diag   CORONARY STENT INTERVENTION N/A 09/14/2018   Procedure: CORONARY STENT INTERVENTION;  Surgeon: Burnell Blanks, MD;  Location: [facility];  Service: Cardiovascular;  Laterality: N/A;   EYE SURGERY Bilateral    blepharoplasty   JOINT REPLACEMENT Left 01/2002   knee   LEFT HEART CATH AND CORONARY ANGIOGRAPHY N/A 09/14/2018   Procedure: LEFT HEART CATH AND CORONARY ANGIOGRAPHY;  Surgeon: Jolaine Artist, MD;  Location: [facility];  Service: Cardiovascular;  Laterality: N/A;   ORIF ANKLE FRACTURE Right 08/03/2018   Procedure: OPEN REDUCTION INTERNAL FIXATION (ORIF) BIMALLEOLAR RIGHT ANKLE FRACTURE;  Surgeon: Hiram Gash, MD;  Location: [facility];  Service: Orthopedics;  Laterality: Right;   TONSILLECTOMY AND ADENOIDECTOMY     TOTAL KNEE ARTHROPLASTY Right 11/09/2021   Procedure: TOTAL KNEE ARTHROPLASTY;  Surgeon: Renette Butters, MD;  Location: [facility];  Service: Orthopedics;  Laterality: Right;   TUBAL LIGATION     Patient Active Problem List   Diagnosis Date Noted   Preop cardiovascular exam 09/21/2021   Dyslipidemia 06/23/2020   OSA on CPAP  06/23/2020   Essential hypertension 06/23/2020   Coronary artery disease 06/23/2020   Unstable angina (HCC) 09/14/2018   Elevated BP without diagnosis of hypertension 06/10/2016    PCP: Adaline Sill, NP  REFERRING PROVIDER: Renette Butters, MD  REFERRING DIAG: Right Total Knee Replacement-DOS 11/09/21  THERAPY DIAG:  Stiffness of right knee, not elsewhere classified  Rationale for Evaluation and Treatment Rehabilitation  ONSET DATE: 11/09/21  SUBJECTIVE:   SUBJECTIVE STATEMENT: Patient reports that her knee feels better today.   PERTINENT HISTORY: History of right ankle fracture, angina, HTN  PAIN:  Are you having pain? Yes: NPRS scale: 5-6/10 Pain location: right thigh and knee Pain description: sore, burning, throbbing, aching Aggravating factors: moving, walking Relieving factors: ice  PRECAUTIONS: None  WEIGHT BEARING RESTRICTIONS No  FALLS:  Has patient fallen in last 6 months? No  LIVING ENVIRONMENT: Lives with: lives with their family Lives in: House/apartment Stairs: No Has following equipment at home: Ramped entry  OCCUPATION: retired  PLOF: [place] be able to travel to [place] (October 17-18), walk without a walker, return to the gym   OBJECTIVE: all objective measures were performed on 11/11/21 unless otherwise noted  PATIENT SURVEYS:  FOTO 23.31  COGNITION:  Overall cognitive status: Within functional limits for tasks assessed     SENSATION: WFL  PALPATION: TTP: right knee  LOWER EXTREMITY ROM:  Active  ROM Right eval Right 8/28 AROM Right PROM eval Left eval  Hip flexion      Hip extension      Hip abduction      Hip adduction      Hip internal rotation      Hip external rotation      Knee flexion 26 81 41 126  Knee extension 11 13  0  Ankle dorsiflexion      Ankle plantarflexion      Ankle inversion      Ankle eversion       (Blank rows = not tested)  GAIT: Assistive device utilized:  Environmental consultant - 2 wheeled Level of assistance: Modified independence Comments: step to pattern  TODAY'S TREATMENT:                                   8/31 EXERCISE LOG  Exercise Repetitions and Resistance Comments  Nustep L3 x 18 minutes; seat 7,6   Rocker board     Marching on foam    Sit to stand   From lowered mat table  LAQ 20 reps 5 sec hold at top   [employer] X 10 hold 5 secs  with PROM and heel prop   Heel slide     Heel/toe ups  2x10 each   12in box lunge 2x10    Blank cell = exercise not performed today  Modalities MANUAL:    PROM for flexion in sitting with active HS curls. Passive extension stretch with heel prop and quad sets, patella mobs Date:  Vaso: Knee, 34 degrees; low pressure, 10 mins, Pain and Edema                                   8/28 EXERCISE LOG  Exercise Repetitions and Resistance Comments  Nustep  L3 x 18 minutes; seat 7-6   Sit to stand  10 reps from lowered mat table   Step up  6" step; 2 minutes RLE leading; cueing to prevent circumduction  Standing hamstring stretch 3 x 30 seconds   Recumbent bike 4 minutes Rocking in available ROM  SLR  20 reps     Blank cell = exercise not performed today  Modalities  Date:  Vaso: Knee, 34 degrees; low pressure, 15 mins, Pain                                   8/25 EXERCISE LOG  Exercise Repetitions and Resistance Comments  Nustep L3 x 21 minutes; seat 9-8   Rocker board 5 minutes   Hamstring stretch  4 x 30 seconds   Lunges onto step 6" step; 2 minutes   Marching on foam 2 minutes   SLR  20 reps     Blank cell = exercise not performed today  Modalities  Date:  Vaso: Knee, 34 degrees; low pressure, 15 mins, Pain and Edema  PATIENT EDUCATION:  Education details: HEP, POC, prognosis, healing Person educated: Patient Education method: Explanation Education comprehension: verbalized understanding   HOME EXERCISE PROGRAM: [id number]  ASSESSMENT:  CLINICAL IMPRESSION: Pt arrived today with no AD and  doing better with RT knee. Rx focused on ROM progression as well as quad  activation. Vaso end of session. Flexion ROM to 90 degrees  OBJECTIVE IMPAIRMENTS Abnormal gait, decreased activity tolerance, decreased balance, decreased mobility, difficulty walking, decreased ROM, decreased strength, hypomobility, increased edema, impaired flexibility, and pain.   ACTIVITY LIMITATIONS carrying, lifting, bending, standing, squatting, sleeping, stairs, transfers, bed mobility, dressing, and locomotion level  PARTICIPATION LIMITATIONS: cleaning, driving, shopping, and community activity  PERSONAL FACTORS 1-2 comorbidities: HTN and unstable angina  are also affecting patient's functional outcome.   REHAB POTENTIAL: Good  CLINICAL DECISION MAKING: Stable/uncomplicated  EVALUATION COMPLEXITY: Low   GOALS: Goals reviewed with patient? No  SHORT TERM GOALS: Target date: 12/02/2021  Patient will be independent with her initial HEP.  Baseline: Goal status: INITIAL  2.  Patient will be able to demonstrate at least 90 degrees of active right knee flexion.  Baseline:  Goal status: IN PROGRESS  3.  Patient will be able to safely ambulate without an assistive device.  Baseline:  Goal status: MET  LONG TERM GOALS: Target date: 12/16/2021   Patient will be independent with her advanced HEP.  Baseline:  Goal status: INITIAL  2.  Patient will be able to achieve active right knee extension within 5 degrees of neutral for improved gait mechanics.  Baseline:  Goal status: IN PROGRESS  3.  Patient will be able to demonstrate at least 120 degrees of active right knee flexion for improved function navigating stairs. Baseline:  Goal status: IN PROGRESS  4.  Patient will be able to ambulate with no significant gait deviations.  Baseline:  Goal status: IN PROGRESS   PLAN: PT FREQUENCY: 3x/week  PT DURATION: other: 5 weeks  PLANNED INTERVENTIONS: Therapeutic exercises, Therapeutic activity,  Neuromuscular re-education, Balance training, Gait training, Patient/Family education, Self Care, Joint mobilization, Stair training, Electrical stimulation, Cryotherapy, Moist heat, Taping, Vasopneumatic device, Manual therapy, and Re-evaluation  PLAN FOR NEXT SESSION: nustep, review HEP, and modalities as needed   Curtistine Pettitt,CHRIS, PTA 11/25/2021, 1:59 PM

## 2021-11-30 ENCOUNTER — Ambulatory Visit: Payer: Medicare Other | Attending: Orthopedic Surgery

## 2021-11-30 DIAGNOSIS — M25561 Pain in right knee: Secondary | ICD-10-CM | POA: Insufficient documentation

## 2021-11-30 DIAGNOSIS — M25661 Stiffness of right knee, not elsewhere classified: Secondary | ICD-10-CM | POA: Insufficient documentation

## 2021-11-30 NOTE — Therapy (Signed)
OUTPATIENT PHYSICAL THERAPY LOWER EXTREMITY TREATMENT   Patient Name: Alyssa Burns MRN: [id number] DOB:June 08, 1950, 71 y.o., female Today's Date: 11/30/2021   PT End of Session - 11/30/21 1041     Visit Number 8    Number of Visits 15    Date for PT Re-Evaluation 01/21/22    PT Start Time 1030    PT Stop Time 1127    PT Time Calculation (min) 57 min    Activity Tolerance Patient tolerated treatment well    Behavior During Therapy WFL for tasks assessed/performed                  Past Medical History:  Diagnosis Date   CAD (coronary artery disease)    a. LHC 09/14/18 90% LAD-DES, 90% OM-PTCA   History of kidney stones    Sleep apnea    Past Surgical History:  Procedure Laterality Date   CORONARY BALLOON ANGIOPLASTY N/A 09/14/2018   Procedure: CORONARY BALLOON ANGIOPLASTY;  Surgeon: Burnell Blanks, MD;  Location: [facility];  Service: Cardiovascular;  Laterality: N/A;  diag   CORONARY STENT INTERVENTION N/A 09/14/2018   Procedure: CORONARY STENT INTERVENTION;  Surgeon: Burnell Blanks, MD;  Location: [facility];  Service: Cardiovascular;  Laterality: N/A;   EYE SURGERY Bilateral    blepharoplasty   JOINT REPLACEMENT Left 01/2002   knee   LEFT HEART CATH AND CORONARY ANGIOGRAPHY N/A 09/14/2018   Procedure: LEFT HEART CATH AND CORONARY ANGIOGRAPHY;  Surgeon: Jolaine Artist, MD;  Location: [facility];  Service: Cardiovascular;  Laterality: N/A;   ORIF ANKLE FRACTURE Right 08/03/2018   Procedure: OPEN REDUCTION INTERNAL FIXATION (ORIF) BIMALLEOLAR RIGHT ANKLE FRACTURE;  Surgeon: Hiram Gash, MD;  Location: [place] [facility];  Service: Orthopedics;  Laterality: Right;   TONSILLECTOMY AND ADENOIDECTOMY     TOTAL KNEE ARTHROPLASTY Right 11/09/2021   Procedure: TOTAL KNEE ARTHROPLASTY;  Surgeon: Renette Butters, MD;  Location: [facility];  Service: Orthopedics;  Laterality: Right;   TUBAL LIGATION     Patient Active  Problem List   Diagnosis Date Noted   Preop cardiovascular exam 09/21/2021   Dyslipidemia 06/23/2020   OSA on CPAP 06/23/2020   Essential hypertension 06/23/2020   Coronary artery disease 06/23/2020   Unstable angina (HCC) 09/14/2018   Elevated BP without diagnosis of hypertension 06/10/2016    PCP: Adaline Sill, NP  REFERRING PROVIDER: Renette Butters, MD  REFERRING DIAG: Right Total Knee Replacement-DOS 11/09/21  THERAPY DIAG:  Stiffness of right knee, not elsewhere classified  Acute pain of right knee  Rationale for Evaluation and Treatment Rehabilitation  ONSET DATE: 11/09/21  SUBJECTIVE:   SUBJECTIVE STATEMENT: Patient reports that her knee is rough today. She notes that she was walking uphill yesterday which bothered her knee.   PERTINENT HISTORY: History of right ankle fracture, angina, HTN  PAIN:  Are you having pain? Yes: NPRS scale: 7\/10 Pain location: right thigh and knee Pain description: sore, burning, throbbing, aching Aggravating factors: moving, walking Relieving factors: ice  PRECAUTIONS: None  WEIGHT BEARING RESTRICTIONS No  FALLS:  Has patient fallen in last 6 months? No  LIVING ENVIRONMENT: Lives with: lives with their family Lives in: House/apartment Stairs: No Has following equipment at home: Ramped entry  [place]: retired  PLOF: Independent  PATIENT GOALS be able to travel to [place] (October 17-18), walk without a walker, return to the gym   OBJECTIVE: all objective measures were performed on 11/11/21  unless otherwise noted  PATIENT SURVEYS:  FOTO 23.31  COGNITION:  Overall cognitive status: Within functional limits for tasks assessed     SENSATION: WFL  PALPATION: TTP: right knee  LOWER EXTREMITY ROM:  Active ROM Right eval Right 8/28 AROM Right PROM eval Left eval  Hip flexion      Hip extension      Hip abduction      Hip adduction      Hip internal rotation      Hip external rotation       Knee flexion 26 81 41 126  Knee extension 11 13  0  Ankle dorsiflexion      Ankle plantarflexion      Ankle inversion      Ankle eversion       (Blank rows = not tested)  GAIT: Assistive device utilized: Environmental consultant - 2 wheeled Level of assistance: Modified independence Comments: step to pattern  TODAY'S TREATMENT:                                   9/5 EXERCISE LOG  Exercise Repetitions and Resistance Comments  Nustep L3 x 15 minutes; seat 6-5   Rocker board  4 minutes   Lateral step ups 6" step (1 minutes) and 8" step (1 minute); 2 minutes   LAQ 5 lbs x 2 minutes   Lunges Onto 8" step; 2 minutes   Marching on BOSU Ball up: 2 minutes   Seated hamstring stretch 4 x 30 seconds    Blank cell = exercise not performed today  Modalities  Date:  Vaso: Knee, 34 degrees; low pressure, 10 mins, Pain and Edema                                   8/31 EXERCISE LOG  Exercise Repetitions and Resistance Comments  Nustep L3 x 18 minutes; seat 7,6   Rocker board     Marching on foam    Sit to stand   From lowered mat table  LAQ 20 reps 5 sec hold at top   [employer] X 10 hold 5 secs  with PROM and heel prop   Heel slide     Heel/toe ups  2x10 each   12in box lunge 2x10    Blank cell = exercise not performed today  Modalities MANUAL:    PROM for flexion in sitting with active HS curls. Passive extension stretch with heel prop and quad sets, patella mobs Date:  Vaso: Knee, 34 degrees; low pressure, 10 mins, Pain and Edema                                   8/28 EXERCISE LOG  Exercise Repetitions and Resistance Comments  Nustep  L3 x 18 minutes; seat 7-6   Sit to stand  10 reps from lowered mat table   Step up  6" step; 2 minutes RLE leading; cueing to prevent circumduction  Standing hamstring stretch 3 x 30 seconds   Recumbent bike 4 minutes Rocking in available ROM  SLR  20 reps     Blank cell = exercise not performed today  Modalities  Date:  Vaso: Knee, 34 degrees; low pressure,  15 mins, Pain  PATIENT EDUCATION:  Education details: heat, ice  Person educated: Patient Education method: Explanation Education comprehension: verbalized understanding   HOME EXERCISE PROGRAM: [id number]  ASSESSMENT:  CLINICAL IMPRESSION: Treatment focused on new and familiar interventions for improved knee mobility and stability on unstable terrain. She required minimal cueing with these interventions proper exercise performance. She experienced a mild increase in right knee pain with today's interventions, but it did not limit her ability to complete these interventions. She reported that her knee felt better upon the conclusion of treatment. She continues to require skilled physical therapy to address her remaining impairments to return to her prior level of function.   OBJECTIVE IMPAIRMENTS Abnormal gait, decreased activity tolerance, decreased balance, decreased mobility, difficulty walking, decreased ROM, decreased strength, hypomobility, increased edema, impaired flexibility, and pain.   ACTIVITY LIMITATIONS carrying, lifting, bending, standing, squatting, sleeping, stairs, transfers, bed mobility, dressing, and locomotion level  PARTICIPATION LIMITATIONS: cleaning, driving, shopping, and community activity  PERSONAL FACTORS 1-2 comorbidities: HTN and unstable angina  are also affecting patient's functional outcome.   REHAB POTENTIAL: Good  CLINICAL DECISION MAKING: Stable/uncomplicated  EVALUATION COMPLEXITY: Low   GOALS: Goals reviewed with patient? No  SHORT TERM GOALS: Target date: 12/02/2021  Patient will be independent with her initial HEP.  Baseline: Goal status: INITIAL  2.  Patient will be able to demonstrate at least 90 degrees of active right knee flexion.  Baseline:  Goal status: IN PROGRESS  3.  Patient will be able to safely ambulate without an assistive device.  Baseline:  Goal status: MET  LONG TERM GOALS: Target date: 12/16/2021   Patient will be  independent with her advanced HEP.  Baseline:  Goal status: INITIAL  2.  Patient will be able to achieve active right knee extension within 5 degrees of neutral for improved gait mechanics.  Baseline:  Goal status: IN PROGRESS  3.  Patient will be able to demonstrate at least 120 degrees of active right knee flexion for improved function navigating stairs. Baseline:  Goal status: IN PROGRESS  4.  Patient will be able to ambulate with no significant gait deviations.  Baseline:  Goal status: IN PROGRESS   PLAN: PT FREQUENCY: 3x/week  PT DURATION: other: 5 weeks  PLANNED INTERVENTIONS: Therapeutic exercises, Therapeutic activity, Neuromuscular re-education, Balance training, Gait training, Patient/Family education, Self Care, Joint mobilization, Stair training, Electrical stimulation, Cryotherapy, Moist heat, Taping, Vasopneumatic device, Manual therapy, and Re-evaluation  PLAN FOR NEXT SESSION: nustep, review HEP, and modalities as needed   Darlin Coco, PT 11/30/2021, 11:46 AM

## 2021-12-02 ENCOUNTER — Ambulatory Visit: Payer: Medicare Other

## 2021-12-02 DIAGNOSIS — M25561 Pain in right knee: Secondary | ICD-10-CM

## 2021-12-02 DIAGNOSIS — M25661 Stiffness of right knee, not elsewhere classified: Secondary | ICD-10-CM | POA: Diagnosis not present

## 2021-12-02 NOTE — Therapy (Signed)
OUTPATIENT PHYSICAL THERAPY LOWER EXTREMITY TREATMENT   Patient Name: Alyssa Burns MRN: [id number] DOB:[birth year], 71 y.o., female Today's Date: 12/02/2021   PT End of Session - 12/02/21 1025     Visit Number 9    Number of Visits 15    Date for PT Re-Evaluation 01/21/22    PT Start Time 1030    PT Stop Time 1145    PT Time Calculation (min) 75 min    Activity Tolerance Patient tolerated treatment well    Behavior During Therapy WFL for tasks assessed/performed                   Past Medical History:  Diagnosis Date   CAD (coronary artery disease)    a. LHC 09/14/18 90% LAD-DES, 90% OM-PTCA   History of kidney stones    Sleep apnea    Past Surgical History:  Procedure Laterality Date   CORONARY BALLOON ANGIOPLASTY N/A 09/14/2018   Procedure: CORONARY BALLOON ANGIOPLASTY;  Surgeon: Burnell Blanks, MD;  Location: [facility];  Service: Cardiovascular;  Laterality: N/A;  diag   CORONARY STENT INTERVENTION N/A 09/14/2018   Procedure: CORONARY STENT INTERVENTION;  Surgeon: Burnell Blanks, MD;  Location: [facility];  Service: Cardiovascular;  Laterality: N/A;   EYE SURGERY Bilateral    blepharoplasty   JOINT REPLACEMENT Left 01/2002   knee   LEFT HEART CATH AND CORONARY ANGIOGRAPHY N/A 09/14/2018   Procedure: LEFT HEART CATH AND CORONARY ANGIOGRAPHY;  Surgeon: Jolaine Artist, MD;  Location: [facility];  Service: Cardiovascular;  Laterality: N/A;   ORIF ANKLE FRACTURE Right 08/03/2018   Procedure: OPEN REDUCTION INTERNAL FIXATION (ORIF) BIMALLEOLAR RIGHT ANKLE FRACTURE;  Surgeon: Hiram Gash, MD;  Location: [facility];  Service: Orthopedics;  Laterality: Right;   TONSILLECTOMY AND ADENOIDECTOMY     TOTAL KNEE ARTHROPLASTY Right 11/09/2021   Procedure: TOTAL KNEE ARTHROPLASTY;  Surgeon: Renette Butters, MD;  Location: [facility];  Service: Orthopedics;  Laterality: Right;   TUBAL LIGATION     Patient  Active Problem List   Diagnosis Date Noted   Preop cardiovascular exam 09/21/2021   Dyslipidemia 06/23/2020   OSA on CPAP 06/23/2020   Essential hypertension 06/23/2020   Coronary artery disease 06/23/2020   Unstable angina (HCC) 09/14/2018   Elevated BP without diagnosis of hypertension 06/10/2016    PCP: Adaline Sill, NP  REFERRING PROVIDER: Renette Butters, MD  REFERRING DIAG: Right Total Knee Replacement-DOS 11/09/21  THERAPY DIAG:  Stiffness of right knee, not elsewhere classified  Acute pain of right knee  Rationale for Evaluation and Treatment Rehabilitation  ONSET DATE: 11/09/21  SUBJECTIVE:   SUBJECTIVE STATEMENT: Patient reports that her knee is stiff this morning. However, her knee is not hurting as bad as it was at her last appointment.   PERTINENT HISTORY: History of right ankle fracture, angina, HTN  PAIN:  Are you having pain? Yes: NPRS scale: 2\/10 Pain location: right thigh and knee Pain description: sore, burning, throbbing, aching Aggravating factors: moving, walking Relieving factors: ice  PRECAUTIONS: None  WEIGHT BEARING RESTRICTIONS No  FALLS:  Has patient fallen in last 6 months? No  LIVING ENVIRONMENT: Lives with: lives with their family Lives in: House/apartment Stairs: No Has following equipment at home: Ramped entry  OCCUPATION: retired  PLOF: [place] be able to travel to [place] (October 17-18), walk without a walker, return to the gym   OBJECTIVE: all objective  measures were performed on 11/11/21 unless otherwise noted  PATIENT SURVEYS:  FOTO 23.31  COGNITION:  Overall cognitive status: Within functional limits for tasks assessed     SENSATION: WFL  PALPATION: TTP: right knee  LOWER EXTREMITY ROM:  Active ROM Right eval Right 8/28 AROM Right 9/7 AROM  Right PROM eval Right 9/7 PROM Left eval  Hip flexion        Hip extension        Hip abduction        Hip adduction         Hip internal rotation        Hip external rotation        Knee flexion 26 81 103 41 106 126  Knee extension _0 0  Ankle dorsiflexion        Ankle plantarflexion        Ankle inversion        Ankle eversion         (Blank rows = not tested)  GAIT: Assistive device utilized: Environmental consultant - 2 wheeled Level of assistance: Modified independence Comments: step to pattern  TODAY'S TREATMENT:                                   9/7 EXERCISE LOG  Exercise Repetitions and Resistance Comments  Nustep L4 x 15 minutes; seat    Recumbent bike L1 x 3 minutes Full revolutions  Rocker board 5 minutes   LAQ 5 lbs x 2 minutes RLE only  Cybex knee flexion  40# x 1 minute and 50# x 2 minutes   Backward stepping 2 minutes   Standing hip ABD  Green t-band; 2 minutes   Seated hamstring stretch 4 x 30 seconds    Blank cell = exercise not performed today  Manual Therapy Soft Tissue Mobilization: right quadriceps, for improved knee flexion Joint Mobilizations: patellar, grade I-IV for knee mobility Passive ROM: flexion and extension, to tolerance   Modalities  Date:  Vaso: Knee, 34 degrees; low pressure, 15 mins, Pain and Edema                                   9/5 EXERCISE LOG  Exercise Repetitions and Resistance Comments  Nustep L3 x 15 minutes; seat 6-5   Rocker board  4 minutes   Lateral step ups 6" step (1 minutes) and 8" step (1 minute); 2 minutes   LAQ 5 lbs x 2 minutes   Lunges Onto 8" step; 2 minutes   Marching on BOSU Ball up: 2 minutes   Seated hamstring stretch 4 x 30 seconds    Blank cell = exercise not performed today  Modalities  Date:  Vaso: Knee, 34 degrees; low pressure, 10 mins, Pain and Edema                                   8/31 EXERCISE LOG  Exercise Repetitions and Resistance Comments  Nustep L3 x 18 minutes; seat 7,6   Rocker board     Marching on foam    Sit to stand   From lowered mat table  LAQ 20 reps 5 sec hold at top   Quad set X 10 hold 5 secs  with  PROM and  heel prop   Heel slide     Heel/toe ups  2x10 each   12in box lunge 2x10    Blank cell = exercise not performed today  Modalities MANUAL:    PROM for flexion in sitting with active HS curls. Passive extension stretch with heel prop and quad sets, patella mobs Date:  Vaso: Knee, 34 degrees; low pressure, 10 mins, Pain and Edema   PATIENT EDUCATION:  Education details: heat, ice Person educated: Patient Education method: Explanation Education comprehension: verbalized understanding   HOME EXERCISE PROGRAM: [id number]  ASSESSMENT:  CLINICAL IMPRESSION: Patient was progressed with multiple new and familiar interventions for improved knee mobility. She was able to complete full revolutions on the recumbent bike for the first time today. Fatigue was her primary limitation with today's interventions with standing hip abduction and long arc quads being the most fatiguing. Manual therapy focused on improved knee flexion through the use of soft tissue mobilization to the quadriceps, patellar mobilizations, and PROM. She was able to demonstrate a significant improvement in her right active and passive ROM since 11/22/21. She was also able to meet all of her short term goals for therapy at this time. Recommend that she continue with skilled physical therapy to address her remaining impairments to return to her prior level of function.   OBJECTIVE IMPAIRMENTS Abnormal gait, decreased activity tolerance, decreased balance, decreased mobility, difficulty walking, decreased ROM, decreased strength, hypomobility, increased edema, impaired flexibility, and pain.   ACTIVITY LIMITATIONS carrying, lifting, bending, standing, squatting, sleeping, stairs, transfers, bed mobility, dressing, and locomotion level  PARTICIPATION LIMITATIONS: cleaning, driving, shopping, and community activity  PERSONAL FACTORS 1-2 comorbidities: HTN and unstable angina  are also affecting patient's functional outcome.    REHAB POTENTIAL: Good  CLINICAL DECISION MAKING: Stable/uncomplicated  EVALUATION COMPLEXITY: Low   GOALS: Goals reviewed with patient? No  SHORT TERM GOALS: Target date: 12/02/2021  Patient will be independent with her initial HEP.  Baseline: Goal status: MET  2.  Patient will be able to demonstrate at least 90 degrees of active right knee flexion.  Baseline:  Goal status: MET  3.  Patient will be able to safely ambulate without an assistive device.  Baseline:  Goal status: MET  LONG TERM GOALS: Target date: 12/16/2021   Patient will be independent with her advanced HEP.  Baseline:  Goal status: IN PROGRESS  2.  Patient will be able to achieve active right knee extension within 5 degrees of neutral for improved gait mechanics.  Baseline:  Goal status: IN PROGRESS  3.  Patient will be able to demonstrate at least 120 degrees of active right knee flexion for improved function navigating stairs. Baseline:  Goal status: IN PROGRESS  4.  Patient will be able to ambulate with no significant gait deviations.  Baseline:  Goal status: MET   PLAN: PT FREQUENCY: 3x/week  PT DURATION: other: 5 weeks  PLANNED INTERVENTIONS: Therapeutic exercises, Therapeutic activity, Neuromuscular re-education, Balance training, Gait training, Patient/Family education, Self Care, Joint mobilization, Stair training, Electrical stimulation, Cryotherapy, Moist heat, Taping, Vasopneumatic device, Manual therapy, and Re-evaluation  PLAN FOR NEXT SESSION: nustep, review HEP, and modalities as needed   Darlin Coco, PT 12/02/2021, 12:04 PM

## 2021-12-06 ENCOUNTER — Ambulatory Visit: Payer: Medicare Other | Admitting: Physical Therapy

## 2021-12-06 ENCOUNTER — Encounter: Payer: Self-pay | Admitting: Physical Therapy

## 2021-12-06 DIAGNOSIS — M25661 Stiffness of right knee, not elsewhere classified: Secondary | ICD-10-CM

## 2021-12-06 DIAGNOSIS — M25561 Pain in right knee: Secondary | ICD-10-CM

## 2021-12-06 NOTE — Therapy (Addendum)
OUTPATIENT PHYSICAL THERAPY LOWER EXTREMITY TREATMENT   Patient Name: Jacqulyne Gladue MRN: [id number] DOB:30-Jun-1950, 71 y.o., female Today's Date: 12/06/2021   PT End of Session - 12/06/21 1042     Visit Number 10    Number of Visits 15    Date for PT Re-Evaluation 01/21/22    PT Start Time 1032    PT Stop Time 1118    PT Time Calculation (min) 46 min    Activity Tolerance Patient tolerated treatment well    Behavior During Therapy WFL for tasks assessed/performed                   Past Medical History:  Diagnosis Date   CAD (coronary artery disease)    a. LHC 09/14/18 90% LAD-DES, 90% OM-PTCA   History of kidney stones    Sleep apnea    Past Surgical History:  Procedure Laterality Date   CORONARY BALLOON ANGIOPLASTY N/A 09/14/2018   Procedure: CORONARY BALLOON ANGIOPLASTY;  Surgeon: Burnell Blanks, MD;  Location: [facility];  Service: Cardiovascular;  Laterality: N/A;  diag   CORONARY STENT INTERVENTION N/A 09/14/2018   Procedure: CORONARY STENT INTERVENTION;  Surgeon: Burnell Blanks, MD;  Location: [facility];  Service: Cardiovascular;  Laterality: N/A;   EYE SURGERY Bilateral    blepharoplasty   JOINT REPLACEMENT Left 01/2002   knee   LEFT HEART CATH AND CORONARY ANGIOGRAPHY N/A 09/14/2018   Procedure: LEFT HEART CATH AND CORONARY ANGIOGRAPHY;  Surgeon: Jolaine Artist, MD;  Location: [facility];  Service: Cardiovascular;  Laterality: N/A;   ORIF ANKLE FRACTURE Right 08/03/2018   Procedure: OPEN REDUCTION INTERNAL FIXATION (ORIF) BIMALLEOLAR RIGHT ANKLE FRACTURE;  Surgeon: Hiram Gash, MD;  Location: [facility];  Service: Orthopedics;  Laterality: Right;   TONSILLECTOMY AND ADENOIDECTOMY     TOTAL KNEE ARTHROPLASTY Right 11/09/2021   Procedure: TOTAL KNEE ARTHROPLASTY;  Surgeon: Renette Butters, MD;  Location: [facility];  Service: Orthopedics;  Laterality: Right;   TUBAL LIGATION     Patient  Active Problem List   Diagnosis Date Noted   Preop cardiovascular exam 09/21/2021   Dyslipidemia 06/23/2020   OSA on CPAP 06/23/2020   Essential hypertension 06/23/2020   Coronary artery disease 06/23/2020   Unstable angina (HCC) 09/14/2018   Elevated BP without diagnosis of hypertension 06/10/2016    PCP: Adaline Sill, NP  REFERRING PROVIDER: Renette Butters, MD  REFERRING DIAG: Right Total Knee Replacement-DOS 11/09/21  THERAPY DIAG:  Stiffness of right knee, not elsewhere classified  Acute pain of right knee  Rationale for Evaluation and Treatment Rehabilitation  ONSET DATE: 11/09/21  SUBJECTIVE:   SUBJECTIVE STATEMENT: Reports stiffness upon arrival.  PERTINENT HISTORY: History of right ankle fracture, angina, HTN  PAIN:  Are you having pain? Yes: NPRS scale: 2/10 Pain location: right thigh and knee Pain description: sore, burning, throbbing, aching Aggravating factors: moving, walking Relieving factors: ice  PRECAUTIONS: None  WEIGHT BEARING RESTRICTIONS No  OBJECTIVE:   PATIENT SURVEYS:  FOTO 23.31  LOWER EXTREMITY ROM:  Active ROM Right eval Right 8/28 AROM Right 9/7 AROM  Right PROM eval Right 9/7 PROM Left eval  Hip flexion        Hip extension        Hip abduction        Hip adduction        Hip internal rotation        Hip external rotation  Knee flexion 26 81 103 41 106 126  Knee extension _0 0  Ankle dorsiflexion        Ankle plantarflexion        Ankle inversion        Ankle eversion         (Blank rows = not tested)  TODAY'S TREATMENT:                                   9/11 EXERCISE LOG  Exercise Repetitions and Resistance Comments  Nustep L6 x 13 minutes; seat 6-4   Recumbent bike L3 x 3 minutes Full revolutions  Rocker board 3 minutes   LAQ 3 lbs x 2 minutes RLE only  Standing hamstring stretch 4 x 30 seconds   Lunges 14" step x20 reps    Blank cell = exercise not performed today    Modalities  Date: 12/06/2021 Vaso: Knee, 34 degrees; low pressure, 10 mins, Pain and Edema  PATIENT EDUCATION:  Education details: heat, ice Person educated: Patient Education method: Explanation Education comprehension: verbalized understanding   HOME EXERCISE PROGRAM: [id number]  ASSESSMENT:  CLINICAL IMPRESSION: Patient presented in clinic with stiffness but also showing good functional progress in her advancement to the stationary bike. Patient also mindful of an anti inflammatory diet. Patient able to demonstrate great quad activation and good gait technique without AD. Normal vasopnuematic response noted following removal of the modality.  12/06/21 PROGRESS REPORT: Patient is making good progress with skilled physical therapy as evidenced by her improved knee mobility and progress toward her goals. She was able to meet all of her short term goals at this time. She is continuing to make progress toward her long term goals, but she has yet to meet these goals at this time. Recommend that she continue with her current plan of care to address her remaining impairments to return to her prior level of function.   Jacqulynn Cadet, PT, DPT   OBJECTIVE IMPAIRMENTS Abnormal gait, decreased activity tolerance, decreased balance, decreased mobility, difficulty walking, decreased ROM, decreased strength, hypomobility, increased edema, impaired flexibility, and pain.   ACTIVITY LIMITATIONS carrying, lifting, bending, standing, squatting, sleeping, stairs, transfers, bed mobility, dressing, and locomotion level  PARTICIPATION LIMITATIONS: cleaning, driving, shopping, and community activity  PERSONAL FACTORS 1-2 comorbidities: HTN and unstable angina  are also affecting patient's functional outcome.   REHAB POTENTIAL: Good  CLINICAL DECISION MAKING: Stable/uncomplicated  EVALUATION COMPLEXITY: Low   GOALS: Goals reviewed with patient? No  SHORT TERM GOALS: Target date: 12/02/2021   Patient will be independent with her initial HEP.  Baseline: Goal status: MET  2.  Patient will be able to demonstrate at least 90 degrees of active right knee flexion.  Baseline:  Goal status: MET  3.  Patient will be able to safely ambulate without an assistive device.  Baseline:  Goal status: MET  LONG TERM GOALS: Target date: 12/16/2021   Patient will be independent with her advanced HEP.  Baseline:  Goal status: IN PROGRESS  2.  Patient will be able to achieve active right knee extension within 5 degrees of neutral for improved gait mechanics.  Baseline:  Goal status: IN PROGRESS  3.  Patient will be able to demonstrate at least 120 degrees of active right knee flexion for improved function navigating stairs. Baseline:  Goal status: IN PROGRESS  4.  Patient will be able to ambulate with no  significant gait deviations.  Baseline:  Goal status: MET   PLAN: PT FREQUENCY: 3x/week  PT DURATION: other: 5 weeks  PLANNED INTERVENTIONS: Therapeutic exercises, Therapeutic activity, Neuromuscular re-education, Balance training, Gait training, Patient/Family education, Self Care, Joint mobilization, Stair training, Electrical stimulation, Cryotherapy, Moist heat, Taping, Vasopneumatic device, Manual therapy, and Re-evaluation  PLAN FOR NEXT SESSION: nustep, review HEP, and modalities as needed   Standley Brooking, PTA 12/06/2021, 12:24 PM

## 2021-12-08 ENCOUNTER — Ambulatory Visit: Payer: Medicare Other | Admitting: Physical Therapy

## 2021-12-08 ENCOUNTER — Encounter: Payer: Self-pay | Admitting: Physical Therapy

## 2021-12-08 DIAGNOSIS — M25561 Pain in right knee: Secondary | ICD-10-CM

## 2021-12-08 DIAGNOSIS — M25661 Stiffness of right knee, not elsewhere classified: Secondary | ICD-10-CM | POA: Diagnosis not present

## 2021-12-08 NOTE — Therapy (Signed)
OUTPATIENT PHYSICAL THERAPY LOWER EXTREMITY TREATMENT   Patient Name: Alyssa Burns MRN: [id number] DOB:Jul 18, 1950, 71 y.o., female Today's Date: 12/08/2021   PT End of Session - 12/08/21 1043     Visit Number 11    Number of Visits 15    Date for PT Re-Evaluation 01/21/22    PT Start Time 1031    PT Stop Time 1118    PT Time Calculation (min) 47 min    Activity Tolerance Patient tolerated treatment well    Behavior During Therapy WFL for tasks assessed/performed              Past Medical History:  Diagnosis Date   CAD (coronary artery disease)    a. LHC 09/14/18 90% LAD-DES, 90% OM-PTCA   History of kidney stones    Sleep apnea    Past Surgical History:  Procedure Laterality Date   CORONARY BALLOON ANGIOPLASTY N/A 09/14/2018   Procedure: CORONARY BALLOON ANGIOPLASTY;  Surgeon: Burnell Blanks, MD;  Location: [facility];  Service: Cardiovascular;  Laterality: N/A;  diag   CORONARY STENT INTERVENTION N/A 09/14/2018   Procedure: CORONARY STENT INTERVENTION;  Surgeon: Burnell Blanks, MD;  Location: [facility];  Service: Cardiovascular;  Laterality: N/A;   EYE SURGERY Bilateral    blepharoplasty   JOINT REPLACEMENT Left 01/2002   knee   LEFT HEART CATH AND CORONARY ANGIOGRAPHY N/A 09/14/2018   Procedure: LEFT HEART CATH AND CORONARY ANGIOGRAPHY;  Surgeon: Jolaine Artist, MD;  Location: [facility];  Service: Cardiovascular;  Laterality: N/A;   ORIF ANKLE FRACTURE Right 08/03/2018   Procedure: OPEN REDUCTION INTERNAL FIXATION (ORIF) BIMALLEOLAR RIGHT ANKLE FRACTURE;  Surgeon: Hiram Gash, MD;  Location: [facility];  Service: Orthopedics;  Laterality: Right;   TONSILLECTOMY AND ADENOIDECTOMY     TOTAL KNEE ARTHROPLASTY Right 11/09/2021   Procedure: TOTAL KNEE ARTHROPLASTY;  Surgeon: Renette Butters, MD;  Location: [facility];  Service: Orthopedics;  Laterality: Right;   TUBAL LIGATION     Patient Active  Problem List   Diagnosis Date Noted   Preop cardiovascular exam 09/21/2021   Dyslipidemia 06/23/2020   OSA on CPAP 06/23/2020   Essential hypertension 06/23/2020   Coronary artery disease 06/23/2020   Unstable angina (HCC) 09/14/2018   Elevated BP without diagnosis of hypertension 06/10/2016    PCP: Adaline Sill, NP  REFERRING PROVIDER: Renette Butters, MD  REFERRING DIAG: Right Total Knee Replacement-DOS 11/09/21  THERAPY DIAG:  Stiffness of right knee, not elsewhere classified  Acute pain of right knee  Rationale for Evaluation and Treatment Rehabilitation  ONSET DATE: 11/09/21  SUBJECTIVE:   SUBJECTIVE STATEMENT: Had a lidoderm patch over incision which has helped.  PERTINENT HISTORY: History of right ankle fracture, angina, HTN  PAIN:  Are you having pain? Yes: NPRS scale: 2/10 Pain location: right knee Pain description: sore, burning, throbbing, aching Aggravating factors: moving, walking Relieving factors: ice  PRECAUTIONS: None  WEIGHT BEARING RESTRICTIONS No  OBJECTIVE:   PATIENT SURVEYS:  FOTO 23.31  LOWER EXTREMITY ROM:  Active ROM Right eval Right 8/28 AROM Right 9/7 AROM  Right PROM eval Right 9/7 PROM Right  9/13 AROM Left eval  Hip flexion         Hip extension         Hip abduction         Hip adduction         Hip internal rotation  Hip external rotation         Knee flexion 26 81 103 41 106 110 126  Knee extension _0 0  Ankle dorsiflexion         Ankle plantarflexion         Ankle inversion         Ankle eversion          (Blank rows = not tested)  TODAY'S TREATMENT:                                   9/13 EXERCISE LOG  Exercise Repetitions and Resistance Comments  Nustep L4 x 15 minutes; seat 6-4   Recumbent bike L3 x 6 minutes, seat 4 Full revolutions  Rocker board 3 minutes   LAQ 5 lbs x 2 minutes RLE only  Standing hamstring stretch 2 x 30 seconds   Lunges 14" step x20 reps   Forward step down  4" x20 reps   R lateral heel dot 4-6" x20 reps    Blank cell = exercise not performed today   Modalities  Date: 12/08/2021 Vaso: Knee, 34 degrees; low pressure, 10 mins, Pain and Edema  PATIENT EDUCATION:  Education details: heat, ice Person educated: Patient Education method: Explanation Education comprehension: verbalized understanding   HOME EXERCISE PROGRAM: [id number]  ASSESSMENT:  CLINICAL IMPRESSION: Patient presented in clinic with functional improvement and returning to recreational walking. Patient walked 3/4 of a mile recently with no exceptional pain afterwards. Great improvement with AROM R knee measurement of 6-110 deg. Normal vasopnuematic response noted following removal of the modality.  OBJECTIVE IMPAIRMENTS Abnormal gait, decreased activity tolerance, decreased balance, decreased mobility, difficulty walking, decreased ROM, decreased strength, hypomobility, increased edema, impaired flexibility, and pain.   ACTIVITY LIMITATIONS carrying, lifting, bending, standing, squatting, sleeping, stairs, transfers, bed mobility, dressing, and locomotion level  PARTICIPATION LIMITATIONS: cleaning, driving, shopping, and community activity  PERSONAL FACTORS 1-2 comorbidities: HTN and unstable angina  are also affecting patient's functional outcome.   REHAB POTENTIAL: Good  CLINICAL DECISION MAKING: Stable/uncomplicated  EVALUATION COMPLEXITY: Low   GOALS: Goals reviewed with patient? No  SHORT TERM GOALS: Target date: 12/02/2021  Patient will be independent with her initial HEP.  Baseline: Goal status: MET  2.  Patient will be able to demonstrate at least 90 degrees of active right knee flexion.  Baseline:  Goal status: MET  3.  Patient will be able to safely ambulate without an assistive device.  Baseline:  Goal status: MET  LONG TERM GOALS: Target date: 12/16/2021   Patient will be independent with her advanced HEP.  Baseline:  Goal status: IN  PROGRESS  2.  Patient will be able to achieve active right knee extension within 5 degrees of neutral for improved gait mechanics.  Baseline:  Goal status: IN PROGRESS  3.  Patient will be able to demonstrate at least 120 degrees of active right knee flexion for improved function navigating stairs. Baseline:  Goal status: IN PROGRESS  4.  Patient will be able to ambulate with no significant gait deviations.  Baseline:  Goal status: MET   PLAN: PT FREQUENCY: 3x/week  PT DURATION: other: 5 weeks  PLANNED INTERVENTIONS: Therapeutic exercises, Therapeutic activity, Neuromuscular re-education, Balance training, Gait training, Patient/Family education, Self Care, Joint mobilization, Stair training, Electrical stimulation, Cryotherapy, Moist heat, Taping, Vasopneumatic device, Manual therapy, and Re-evaluation  PLAN FOR NEXT SESSION: nustep, review  HEP, and modalities as needed   Standley Brooking, PTA 12/08/2021, 11:52 AM

## 2021-12-10 ENCOUNTER — Ambulatory Visit: Payer: Medicare Other | Admitting: Physical Therapy

## 2021-12-10 ENCOUNTER — Encounter: Payer: Self-pay | Admitting: Physical Therapy

## 2021-12-10 DIAGNOSIS — M25561 Pain in right knee: Secondary | ICD-10-CM

## 2021-12-10 DIAGNOSIS — M25661 Stiffness of right knee, not elsewhere classified: Secondary | ICD-10-CM

## 2021-12-10 NOTE — Therapy (Signed)
OUTPATIENT PHYSICAL THERAPY LOWER EXTREMITY TREATMENT   Patient Name: Alyssa Burns MRN: [id number] DOB:January 14, 1951, 71 y.o., female Today's Date: 12/10/2021   PT End of Session - 12/10/21 1033     Visit Number 12    Number of Visits 15    Date for PT Re-Evaluation 01/21/22    PT Start Time 1031    PT Stop Time 1118    PT Time Calculation (min) 47 min    Activity Tolerance Patient tolerated treatment well    Behavior During Therapy WFL for tasks assessed/performed              Past Medical History:  Diagnosis Date   CAD (coronary artery disease)    a. LHC 09/14/18 90% LAD-DES, 90% OM-PTCA   History of kidney stones    Sleep apnea    Past Surgical History:  Procedure Laterality Date   CORONARY BALLOON ANGIOPLASTY N/A 09/14/2018   Procedure: CORONARY BALLOON ANGIOPLASTY;  Surgeon: Burnell Blanks, MD;  Location: [facility];  Service: Cardiovascular;  Laterality: N/A;  diag   CORONARY STENT INTERVENTION N/A 09/14/2018   Procedure: CORONARY STENT INTERVENTION;  Surgeon: Burnell Blanks, MD;  Location: [facility];  Service: Cardiovascular;  Laterality: N/A;   EYE SURGERY Bilateral    blepharoplasty   JOINT REPLACEMENT Left 01/2002   knee   LEFT HEART CATH AND CORONARY ANGIOGRAPHY N/A 09/14/2018   Procedure: LEFT HEART CATH AND CORONARY ANGIOGRAPHY;  Surgeon: Jolaine Artist, MD;  Location: [facility];  Service: Cardiovascular;  Laterality: N/A;   ORIF ANKLE FRACTURE Right 08/03/2018   Procedure: OPEN REDUCTION INTERNAL FIXATION (ORIF) BIMALLEOLAR RIGHT ANKLE FRACTURE;  Surgeon: Hiram Gash, MD;  Location: [facility];  Service: Orthopedics;  Laterality: Right;   TONSILLECTOMY AND ADENOIDECTOMY     TOTAL KNEE ARTHROPLASTY Right 11/09/2021   Procedure: TOTAL KNEE ARTHROPLASTY;  Surgeon: Renette Butters, MD;  Location: [facility];  Service: Orthopedics;  Laterality: Right;   TUBAL LIGATION     Patient Active  Problem List   Diagnosis Date Noted   Preop cardiovascular exam 09/21/2021   Dyslipidemia 06/23/2020   OSA on CPAP 06/23/2020   Essential hypertension 06/23/2020   Coronary artery disease 06/23/2020   Unstable angina (HCC) 09/14/2018   Elevated BP without diagnosis of hypertension 06/10/2016    PCP: Adaline Sill, NP  REFERRING PROVIDER: Renette Butters, MD  REFERRING DIAG: Right Total Knee Replacement-DOS 11/09/21  THERAPY DIAG:  Stiffness of right knee, not elsewhere classified  Acute pain of right knee  Rationale for Evaluation and Treatment Rehabilitation  ONSET DATE: 11/09/21  SUBJECTIVE:   SUBJECTIVE STATEMENT: More achey today but doesn't have a lidoderm patch on. Walked a mile yesterday in her neighborhood which [place].  PERTINENT HISTORY: History of right ankle fracture, angina, HTN  PAIN:  Are you having pain? Yes: NPRS scale: 4/10 Pain location: right knee Pain description: Ache Aggravating factors: moving, walking Relieving factors: ice  PRECAUTIONS: None  WEIGHT BEARING RESTRICTIONS No  OBJECTIVE:   PATIENT SURVEYS:  FOTO 23.31  LOWER EXTREMITY ROM:  Active ROM Right eval Right 8/28 AROM Right 9/7 AROM  Right PROM eval Right 9/7 PROM Right  9/13 AROM Left eval  Hip flexion         Hip extension         Hip abduction         Hip adduction  Hip internal rotation         Hip external rotation         Knee flexion 26 81 103 41 106 110 126  Knee extension 11 13 7   6 0  Ankle dorsiflexion         Ankle plantarflexion         Ankle inversion         Ankle eversion          (Blank rows = not tested)  TODAY'S TREATMENT:                                   9/13 EXERCISE LOG  Exercise Repetitions and Resistance Comments  Nustep L4 x 15 minutes; seat 6-4   Recumbent bike L3 x 10 minutes, seat 4 Full revolutions  Rocker board 3 minutes   Forward step down 6" x15 reps   Leg press 1 pl, seat 5 x20 reps    Blank cell =  exercise not performed today   Modalities  Date: 12/08/2021 Vaso: Knee, 34 degrees; low pressure, 10 mins, Pain and Edema  PATIENT EDUCATION:  Education details: heat, ice Person educated: Patient Education method: Explanation Education comprehension: verbalized understanding   HOME EXERCISE PROGRAM: [id number]  ASSESSMENT:  CLINICAL IMPRESSION: Patient presented in clinic with reports of more ache this morning. Patient trying to return to activity such as recreational exercise and walking program. Patient experiencing more discomfort with step down training today. More superficial edema noted along superior R knee upon observation. Normal vasopnuematic response noted following removal of the modality.  OBJECTIVE IMPAIRMENTS Abnormal gait, decreased activity tolerance, decreased balance, decreased mobility, difficulty walking, decreased ROM, decreased strength, hypomobility, increased edema, impaired flexibility, and pain.   ACTIVITY LIMITATIONS carrying, lifting, bending, standing, squatting, sleeping, stairs, transfers, bed mobility, dressing, and locomotion level  PARTICIPATION LIMITATIONS: cleaning, driving, shopping, and community activity  PERSONAL FACTORS 1-2 comorbidities: HTN and unstable angina  are also affecting patient's functional outcome.   REHAB POTENTIAL: Good  CLINICAL DECISION MAKING: Stable/uncomplicated  EVALUATION COMPLEXITY: Low   GOALS: Goals reviewed with patient? No  SHORT TERM GOALS: Target date: 12/02/2021  Patient will be independent with her initial HEP.  Baseline: Goal status: MET  2.  Patient will be able to demonstrate at least 90 degrees of active right knee flexion.  Baseline:  Goal status: MET  3.  Patient will be able to safely ambulate without an assistive device.  Baseline:  Goal status: MET  LONG TERM GOALS: Target date: 12/16/2021   Patient will be independent with her advanced HEP.  Baseline:  Goal status: IN PROGRESS  2.   Patient will be able to achieve active right knee extension within 5 degrees of neutral for improved gait mechanics.  Baseline:  Goal status: IN PROGRESS  3.  Patient will be able to demonstrate at least 120 degrees of active right knee flexion for improved function navigating stairs. Baseline:  Goal status: IN PROGRESS  4.  Patient will be able to ambulate with no significant gait deviations.  Baseline:  Goal status: MET   PLAN: PT FREQUENCY: 3x/week  PT DURATION: other: 5 weeks  PLANNED INTERVENTIONS: Therapeutic exercises, Therapeutic activity, Neuromuscular re-education, Balance training, Gait training, Patient/Family education, Self Care, Joint mobilization, Stair training, Electrical stimulation, Cryotherapy, Moist heat, Taping, Vasopneumatic device, Manual therapy, and Re-evaluation  PLAN FOR NEXT SESSION: nustep, review HEP, and modalities as   needed   Kelsey P Kennon, PTA 12/10/2021, 11:46 AM  

## 2021-12-20 ENCOUNTER — Encounter: Payer: Self-pay | Admitting: *Deleted

## 2021-12-20 ENCOUNTER — Ambulatory Visit: Payer: Medicare Other | Admitting: *Deleted

## 2021-12-20 DIAGNOSIS — M25661 Stiffness of right knee, not elsewhere classified: Secondary | ICD-10-CM | POA: Diagnosis not present

## 2021-12-20 DIAGNOSIS — M25561 Pain in right knee: Secondary | ICD-10-CM

## 2021-12-20 NOTE — Therapy (Signed)
OUTPATIENT PHYSICAL THERAPY LOWER EXTREMITY TREATMENT   Patient Name: Alyssa Burns MRN: [id number] DOB:[birth year], 71 y.o., female Today's Date: 12/20/2021   PT End of Session - 12/20/21 1044     Visit Number 13    Number of Visits 15    Date for PT Re-Evaluation 01/21/22    PT Start Time 11    PT Stop Time 1125    PT Time Calculation (min) 55 min              Past Medical History:  Diagnosis Date   CAD (coronary artery disease)    a. LHC 09/14/18 90% LAD-DES, 90% OM-PTCA   History of kidney stones    Sleep apnea    Past Surgical History:  Procedure Laterality Date   CORONARY BALLOON ANGIOPLASTY N/A 09/14/2018   Procedure: CORONARY BALLOON ANGIOPLASTY;  Surgeon: Burnell Blanks, MD;  Location: [place] [facility];  Service: Cardiovascular;  Laterality: N/A;  diag   CORONARY STENT INTERVENTION N/A 09/14/2018   Procedure: CORONARY STENT INTERVENTION;  Surgeon: Burnell Blanks, MD;  Location: [facility];  Service: Cardiovascular;  Laterality: N/A;   EYE SURGERY Bilateral    blepharoplasty   JOINT REPLACEMENT Left 01/2002   knee   LEFT HEART CATH AND CORONARY ANGIOGRAPHY N/A 09/14/2018   Procedure: LEFT HEART CATH AND CORONARY ANGIOGRAPHY;  Surgeon: Jolaine Artist, MD;  Location: [facility];  Service: Cardiovascular;  Laterality: N/A;   ORIF ANKLE FRACTURE Right 08/03/2018   Procedure: OPEN REDUCTION INTERNAL FIXATION (ORIF) BIMALLEOLAR RIGHT ANKLE FRACTURE;  Surgeon: Hiram Gash, MD;  Location: [place];  Service: Orthopedics;  Laterality: Right;   TONSILLECTOMY AND ADENOIDECTOMY     TOTAL KNEE ARTHROPLASTY Right 11/09/2021   Procedure: TOTAL KNEE ARTHROPLASTY;  Surgeon: Renette Butters, MD;  Location: [facility];  Service: Orthopedics;  Laterality: Right;   TUBAL LIGATION     Patient Active Problem List   Diagnosis Date Noted   Preop cardiovascular exam 09/21/2021   Dyslipidemia 06/23/2020   OSA on CPAP  06/23/2020   Essential hypertension 06/23/2020   Coronary artery disease 06/23/2020   Unstable angina (HCC) 09/14/2018   Elevated BP without diagnosis of hypertension 06/10/2016    PCP: Adaline Sill, NP  REFERRING PROVIDER: Renette Butters, MD  REFERRING DIAG: Right Total Knee Replacement-DOS 11/09/21  THERAPY DIAG:  Stiffness of right knee, not elsewhere classified  Acute pain of right knee  Rationale for Evaluation and Treatment Rehabilitation  ONSET DATE: 11/09/21  SUBJECTIVE:   SUBJECTIVE STATEMENT:  Doing good today RT knee. To MD Wednesday   PERTINENT HISTORY: History of right ankle fracture, angina, HTN  PAIN:  Are you having pain? Yes: NPRS scale: 4/10 Pain location: right knee Pain description: Ache Aggravating factors: moving, walking Relieving factors: ice  PRECAUTIONS: None  WEIGHT BEARING RESTRICTIONS No  OBJECTIVE:   PATIENT SURVEYS:  FOTO 23.31  LOWER EXTREMITY ROM:  Active ROM Right eval Right 8/28 AROM Right 9/7 AROM  Right PROM eval Right 9/7 PROM Right  9/13 AROM Left eval  Hip flexion         Hip extension         Hip abduction         Hip adduction         Hip internal rotation         Hip external rotation         Knee flexion 26 81 103 41  106 110 126  Knee extension _0 0  Ankle dorsiflexion         Ankle plantarflexion         Ankle inversion         Ankle eversion          (Blank rows = not tested)  TODAY'S TREATMENT:                                   9/25   EXERCISE LOG  Exercise Repetitions and Resistance Comments  Nustep L4 x 10 minutes; seat 6-4   Recumbent bike L3 x 10 minutes, seat 3 Full revolutions  Rocker board 3 minutes   Forward step down 4" x20 reps   Leg press 1 pl, seat 5 x20 reps   Knee EXT 20# x25   Knee flexion 40# x20    Blank cell = exercise not performed today   Modalities  Date: 12/08/2021 Vaso: Knee, 34 degrees; low pressure, 10 mins, Pain and Edema  PATIENT EDUCATION:   Education details: heat, ice Person educated: Patient Education method: Explanation Education comprehension: verbalized understanding   HOME EXERCISE PROGRAM: [id number]  ASSESSMENT:  CLINICAL IMPRESSION: Pt arrived today doing fairly well with RT knee. She was able to continue with functional exs as well as [place] strengthening with mainly fatigue end of session. Vaso end of session. To MD after next visit    OBJECTIVE IMPAIRMENTS Abnormal gait, decreased activity tolerance, decreased balance, decreased mobility, difficulty walking, decreased ROM, decreased strength, hypomobility, increased edema, impaired flexibility, and pain.   ACTIVITY LIMITATIONS carrying, lifting, bending, standing, squatting, sleeping, stairs, transfers, bed mobility, dressing, and locomotion level  PARTICIPATION LIMITATIONS: cleaning, driving, shopping, and community activity  PERSONAL FACTORS 1-2 comorbidities: HTN and unstable angina  are also affecting patient's functional outcome.   REHAB POTENTIAL: Good  CLINICAL DECISION MAKING: Stable/uncomplicated  EVALUATION COMPLEXITY: Low   GOALS: Goals reviewed with patient? No  SHORT TERM GOALS: Target date: 12/02/2021  Patient will be independent with her initial HEP.  Baseline: Goal status: MET  2.  Patient will be able to demonstrate at least 90 degrees of active right knee flexion.  Baseline:  Goal status: MET  3.  Patient will be able to safely ambulate without an assistive device.  Baseline:  Goal status: MET  LONG TERM GOALS: Target date: 12/16/2021   Patient will be independent with her advanced HEP.  Baseline:  Goal status: IN PROGRESS  2.  Patient will be able to achieve active right knee extension within 5 degrees of neutral for improved gait mechanics.  Baseline:  Goal status: IN PROGRESS  3.  Patient will be able to demonstrate at least 120 degrees of active right knee flexion for improved function navigating stairs. Baseline:   Goal status: IN PROGRESS  4.  Patient will be able to ambulate with no significant gait deviations.  Baseline:  Goal status: MET   PLAN: PT FREQUENCY: 3x/week  PT DURATION: other: 5 weeks  PLANNED INTERVENTIONS: Therapeutic exercises, Therapeutic activity, Neuromuscular re-education, Balance training, Gait training, Patient/Family education, Self Care, Joint mobilization, Stair training, Electrical stimulation, Cryotherapy, Moist heat, Taping, Vasopneumatic device, Manual therapy, and Re-evaluation  PLAN FOR NEXT SESSION: nustep, review HEP, and modalities as needed   Jakeria Caissie,CHRIS, PTA 12/20/2021, 12:17 PM

## 2021-12-22 ENCOUNTER — Ambulatory Visit: Payer: Medicare Other

## 2021-12-22 DIAGNOSIS — M25661 Stiffness of right knee, not elsewhere classified: Secondary | ICD-10-CM | POA: Diagnosis not present

## 2021-12-22 DIAGNOSIS — M25561 Pain in right knee: Secondary | ICD-10-CM

## 2021-12-22 NOTE — Therapy (Addendum)
OUTPATIENT PHYSICAL THERAPY LOWER EXTREMITY TREATMENT   Patient Name: Alyssa Burns MRN: [id number] DOB:12/26/1950, 71 y.o., female Today's Date: 12/22/2021   PT End of Session - 12/22/21 1032     Visit Number 14    Number of Visits 15    Date for PT Re-Evaluation 01/21/22    PT Start Time 1030    PT Stop Time 1122    PT Time Calculation (min) 52 min              Past Medical History:  Diagnosis Date   CAD (coronary artery disease)    a. LHC 09/14/18 90% LAD-DES, 90% OM-PTCA   History of kidney stones    Sleep apnea    Past Surgical History:  Procedure Laterality Date   CORONARY BALLOON ANGIOPLASTY N/A 09/14/2018   Procedure: CORONARY BALLOON ANGIOPLASTY;  Surgeon: McAlhany, Christopher D, MD;  Location: [facility];  Service: Cardiovascular;  Laterality: N/A;  diag   CORONARY STENT INTERVENTION N/A 09/14/2018   Procedure: CORONARY STENT INTERVENTION;  Surgeon: McAlhany, Christopher D, MD;  Location: [facility];  Service: Cardiovascular;  Laterality: N/A;   EYE SURGERY Bilateral    blepharoplasty   JOINT REPLACEMENT Left 01/2002   knee   LEFT HEART CATH AND CORONARY ANGIOGRAPHY N/A 09/14/2018   Procedure: LEFT HEART CATH AND CORONARY ANGIOGRAPHY;  Surgeon: Bensimhon, Daniel R, MD;  Location: [facility];  Service: Cardiovascular;  Laterality: N/A;   ORIF ANKLE FRACTURE Right 08/03/2018   Procedure: OPEN REDUCTION INTERNAL FIXATION (ORIF) BIMALLEOLAR RIGHT ANKLE FRACTURE;  Surgeon: Varkey, Dax T, MD;  Location: [facility];  Service: Orthopedics;  Laterality: Right;   TONSILLECTOMY AND ADENOIDECTOMY     TOTAL KNEE ARTHROPLASTY Right 11/09/2021   Procedure: TOTAL KNEE ARTHROPLASTY;  Surgeon: Murphy, Timothy D, MD;  Location: [facility];  Service: Orthopedics;  Laterality: Right;   TUBAL LIGATION     Patient Active Problem List   Diagnosis Date Noted   Preop cardiovascular exam 09/21/2021   Dyslipidemia 06/23/2020   OSA on CPAP  06/23/2020   Essential hypertension 06/23/2020   Coronary artery disease 06/23/2020   Unstable angina (HCC) 09/14/2018   Elevated BP without diagnosis of hypertension 06/10/2016    PCP: Dickey, Kirkland M, NP  REFERRING PROVIDER: Murphy, Timothy D, MD  REFERRING DIAG: Right Total Knee Replacement-DOS 11/09/21  THERAPY DIAG:  Stiffness of right knee, not elsewhere classified  Acute pain of right knee  Rationale for Evaluation and Treatment Rehabilitation  ONSET DATE: 11/09/21  SUBJECTIVE:   SUBJECTIVE STATEMENT: Pt reports feeling good today.  Goes to MD tomorrow.    PERTINENT HISTORY: History of right ankle fracture, angina, HTN  PAIN:  Are you having pain? No  PRECAUTIONS: None  WEIGHT BEARING RESTRICTIONS No  OBJECTIVE:   PATIENT SURVEYS:  FOTO 23.31  LOWER EXTREMITY ROM:  Active ROM Right eval Right 8/28 AROM Right 9/7 AROM  Right PROM eval Right 9/7 PROM Right  9/13 AROM Left eval  Hip flexion         Hip extension         Hip abduction         Hip adduction         Hip internal rotation         Hip external rotation         Knee flexion 26 81 103 41 106 110 126  Knee extension 11 13 7   6 0  Ankle   dorsiflexion         Ankle plantarflexion         Ankle inversion         Ankle eversion          (Blank rows = not tested)  TODAY'S TREATMENT:                          9/27    EXERCISE LOG  Exercise Repetitions and Resistance Comments  Recumbent bike L3 x 15 minutes, seat 1 Full revolutions  Rocker board 3 minutes   Forward step down 4" x 20 reps   Mini Squats BOSU ball down x 15 rep   Lunges on BOSU 20 reps   Leg press 2 pl, seat 5 x25 reps   Knee EXT 30# x25 reps   Knee flexion 50# x25 reps   SLS x 5 cones 2 reps    Blank cell = exercise not performed today   Modalities  Date: 12/22/2021 Vaso: Knee, 34 degrees; low pressure, 10 mins, Pain and Edema  PATIENT EDUCATION:  Education details: heat, ice Person educated: Patient Education  method: Explanation Education comprehension: verbalized understanding   HOME EXERCISE PROGRAM: [id number]  ASSESSMENT:  CLINICAL IMPRESSION: Pt arrives for today's treatment session denying any pain.  Pt able to progress to seat one on recumbent bike today.  Pt able to increase FOTO score to 72 today.  Pt has met all of her goals at this time except her flexion goal; which she is one degree short of.  Pt able to tolerate BOSU and SLS activities today which were both challenging.  Normal responses to vaso noted upon removal.  Pt denied any pain at completion of today's treatment session.   12/22/21 PROGRESS REPORT: Patient is making good progress with skilled physical therapy as evidenced by her improved functional mobility, subjective reports, objective measures, and progress toward her goals. She has been able to meet or nearly meet all of her goals for physical therapy. Recommend that she be discharged at her next appointment with a HEP to continue to improve her knee strength and mobility.   Jarrett Barts, PT, DPT   OBJECTIVE IMPAIRMENTS Abnormal gait, decreased activity tolerance, decreased balance, decreased mobility, difficulty walking, decreased ROM, decreased strength, hypomobility, increased edema, impaired flexibility, and pain.   ACTIVITY LIMITATIONS carrying, lifting, bending, standing, squatting, sleeping, stairs, transfers, bed mobility, dressing, and locomotion level  PARTICIPATION LIMITATIONS: cleaning, driving, shopping, and community activity  PERSONAL FACTORS 1-2 comorbidities: HTN and unstable angina  are also affecting patient's functional outcome.   REHAB POTENTIAL: Good  CLINICAL DECISION MAKING: Stable/uncomplicated  EVALUATION COMPLEXITY: Low   GOALS: Goals reviewed with patient? No  SHORT TERM GOALS: Target date: 12/02/2021  Patient will be independent with her initial HEP.  Baseline: Goal status: MET  2.  Patient will be able to demonstrate at least 90  degrees of active right knee flexion.  Baseline:  Goal status: MET  3.  Patient will be able to safely ambulate without an assistive device.  Baseline:  Goal status: MET  LONG TERM GOALS: Target date: 12/16/2021   Patient will be independent with her advanced HEP.  Baseline:  Goal status: MET  2.  Patient will be able to achieve active right knee extension within 5 degrees of neutral for improved gait mechanics.  Baseline: 12/22/21: -4 degrees of extension Goal status: MET  3.  Patient will be able to demonstrate at least 120 degrees   of active right knee flexion for improved function navigating stairs. Baseline: 12/22/21: 119 degrees Goal status: IN PROGRESS  4.  Patient will be able to ambulate with no significant gait deviations.  Baseline:  Goal status: MET   PLAN: PT FREQUENCY: 3x/week  PT DURATION: other: 5 weeks  PLANNED INTERVENTIONS: Therapeutic exercises, Therapeutic activity, Neuromuscular re-education, Balance training, Gait training, Patient/Family education, Self Care, Joint mobilization, Stair training, Electrical stimulation, Cryotherapy, Moist heat, Taping, Vasopneumatic device, Manual therapy, and Re-evaluation  PLAN FOR NEXT SESSION: nustep, review HEP, and modalities as needed   Kathrynn Ducking, PTA 12/22/2021, 11:22 AM

## 2021-12-24 ENCOUNTER — Encounter: Payer: Self-pay | Admitting: *Deleted

## 2021-12-24 ENCOUNTER — Ambulatory Visit: Payer: Medicare Other | Admitting: *Deleted

## 2021-12-24 DIAGNOSIS — M25561 Pain in right knee: Secondary | ICD-10-CM

## 2021-12-24 DIAGNOSIS — M25661 Stiffness of right knee, not elsewhere classified: Secondary | ICD-10-CM

## 2021-12-24 NOTE — Therapy (Signed)
OUTPATIENT PHYSICAL THERAPY LOWER EXTREMITY TREATMENT   Patient Name: Alyssa Burns MRN: [id number] DOB:[birth year], 71 y.o., female Today's Date: 12/24/2021   PT End of Session - 12/24/21 1046     Visit Number 15    Number of Visits 15    Date for PT Re-Evaluation 01/21/22    PT Start Time 66    PT Stop Time 1120    PT Time Calculation (min) 50 min              Past Medical History:  Diagnosis Date   CAD (coronary artery disease)    a. LHC 09/14/18 90% LAD-DES, 90% OM-PTCA   History of kidney stones    Sleep apnea    Past Surgical History:  Procedure Laterality Date   CORONARY BALLOON ANGIOPLASTY N/A 09/14/2018   Procedure: CORONARY BALLOON ANGIOPLASTY;  Surgeon: Burnell Blanks, MD;  Location: [facility] [facility];  Service: Cardiovascular;  Laterality: N/A;  diag   CORONARY STENT INTERVENTION N/A 09/14/2018   Procedure: CORONARY STENT INTERVENTION;  Surgeon: Burnell Blanks, MD;  Location: [facility];  Service: Cardiovascular;  Laterality: N/A;   EYE SURGERY Bilateral    blepharoplasty   JOINT REPLACEMENT Left 01/2002   knee   LEFT HEART CATH AND CORONARY ANGIOGRAPHY N/A 09/14/2018   Procedure: LEFT HEART CATH AND CORONARY ANGIOGRAPHY;  Surgeon: Jolaine Artist, MD;  Location: [place] [facility];  Service: Cardiovascular;  Laterality: N/A;   ORIF ANKLE FRACTURE Right 08/03/2018   Procedure: OPEN REDUCTION INTERNAL FIXATION (ORIF) BIMALLEOLAR RIGHT ANKLE FRACTURE;  Surgeon: Hiram Gash, MD;  Location: [place] [facility];  Service: Orthopedics;  Laterality: Right;   TONSILLECTOMY AND ADENOIDECTOMY     TOTAL KNEE ARTHROPLASTY Right 11/09/2021   Procedure: TOTAL KNEE ARTHROPLASTY;  Surgeon: Renette Butters, MD;  Location: [facility];  Service: Orthopedics;  Laterality: Right;   TUBAL LIGATION     Patient Active Problem List   Diagnosis Date Noted   Preop cardiovascular exam 09/21/2021   Dyslipidemia 06/23/2020   OSA on CPAP  06/23/2020   Essential hypertension 06/23/2020   Coronary artery disease 06/23/2020   Unstable angina (HCC) 09/14/2018   Elevated BP without diagnosis of hypertension 06/10/2016    PCP: Adaline Sill, NP  REFERRING PROVIDER: Renette Butters, MD  REFERRING DIAG: Right Total Knee Replacement-DOS 11/09/21  THERAPY DIAG:  Acute pain of right knee  Stiffness of right knee, not elsewhere classified  Rationale for Evaluation and Treatment Rehabilitation  ONSET DATE: 11/09/21  SUBJECTIVE:   SUBJECTIVE STATEMENT: Pt reports feeling good today and ready to DC   PERTINENT HISTORY: History of right ankle fracture, angina, HTN  PAIN:  Are you having pain? No  PRECAUTIONS: None  WEIGHT BEARING RESTRICTIONS No  OBJECTIVE:   PATIENT SURVEYS:  FOTO 23.31  LOWER EXTREMITY ROM:  Active ROM Right eval Right 8/28 AROM Right 9/7 AROM  Right PROM eval Right 9/7 PROM Right  9/13 AROM Left eval  Hip flexion         Hip extension         Hip abduction         Hip adduction         Hip internal rotation         Hip external rotation         Knee flexion 26 81 103 41 106 110 126  Knee extension _0 0  Ankle dorsiflexion  Ankle plantarflexion         Ankle inversion         Ankle eversion          (Blank rows = not tested)  TODAY'S TREATMENT:                          9/29    EXERCISE LOG  Exercise Repetitions and Resistance Comments  Recumbent bike L3 x 12 minutes, seat 1 Full revolutions  Rocker board    Elliptical  X 10 mins forw/back   Forward step down    Mini Squats  x 10 rep   Balnce blue XTS SLS 2x10   Lunges on BOSU    Leg press    Knee EXT 30# x25 reps   Knee flexion 50# x25 reps   SLS x 5 cones     Blank cell = exercise not performed today   Modalities  Date: 12/22/2021 Vaso: Knee, 34 degrees; low pressure, 15 mins, Pain and Edema  PATIENT EDUCATION:  Education details: heat, ice Person educated: Patient Education method:  Explanation Education comprehension: verbalized understanding   HOME EXERCISE PROGRAM: [id number]  ASSESSMENT:  CLINICAL IMPRESSION: Pt arrives for today's treatment session denying any pain. Rx focused on HEP and progression of independent gym exs/act.'s for Pt to continue with Knee  rehab. Pt has Met majority of goals , but not flexion LTG due to mild deficits  OBJECTIVE IMPAIRMENTS Abnormal gait, decreased activity tolerance, decreased balance, decreased mobility, difficulty walking, decreased ROM, decreased strength, hypomobility, increased edema, impaired flexibility, and pain.   ACTIVITY LIMITATIONS carrying, lifting, bending, standing, squatting, sleeping, stairs, transfers, bed mobility, dressing, and locomotion level  PARTICIPATION LIMITATIONS: cleaning, driving, shopping, and community activity  PERSONAL FACTORS 1-2 comorbidities: HTN and unstable angina  are also affecting patient's functional outcome.   REHAB POTENTIAL: Good  CLINICAL DECISION MAKING: Stable/uncomplicated  EVALUATION COMPLEXITY: Low   GOALS: Goals reviewed with patient? No  SHORT TERM GOALS: Target date: 12/02/2021  Patient will be independent with her initial HEP.  Baseline: Goal status: MET  2.  Patient will be able to demonstrate at least 90 degrees of active right knee flexion.  Baseline:  Goal status: MET  3.  Patient will be able to safely ambulate without an assistive device.  Baseline:  Goal status: MET  LONG TERM GOALS: Target date: 12/16/2021   Patient will be independent with her advanced HEP.  Baseline:  Goal status: MET  2.  Patient will be able to achieve active right knee extension within 5 degrees of neutral for improved gait mechanics.  Baseline: 12/22/21: -4 degrees of extension Goal status: MET  3.  Patient will be able to demonstrate at least 120 degrees of active right knee flexion for improved function navigating stairs. Baseline: 12/22/21: 119 degrees Goal status: Not  MET  4.  Patient will be able to ambulate with no significant gait deviations.  Baseline:  Goal status: MET   PLAN: PT FREQUENCY: 3x/week  PT DURATION: other: 5 weeks  PLANNED INTERVENTIONS: Therapeutic exercises, Therapeutic activity, Neuromuscular re-education, Balance training, Gait training, Patient/Family education, Self Care, Joint mobilization, Stair training, Electrical stimulation, Cryotherapy, Moist heat, Taping, Vasopneumatic device, Manual therapy, and Re-evaluation  PLAN FOR NEXT SESSION:  DC to gym and HEP   Hermilo Dutter,CHRIS, PTA 12/24/2021, 12:35 PM   PHYSICAL THERAPY DISCHARGE SUMMARY  Visits from Start of Care: 15  Current functional level related to goals / functional outcomes: Patient  was able to meet most of her goals for physical therapy. The only goal she was unable to completely achieve was her active knee flexion which was 1 degree from meeting her goal of 120 degrees.    Remaining deficits: None    Education / Equipment: HEP   Patient agrees to discharge. Patient goals were met. Patient is being discharged due to being pleased with the current functional level.  Jacqulynn Cadet, PT, DPT

## 2023-04-04 ENCOUNTER — Ambulatory Visit: Payer: Medicare Other | Admitting: Cardiology

## 2023-04-04 ENCOUNTER — Encounter: Payer: Self-pay | Admitting: Cardiology

## 2023-04-04 VITALS — BP 162/90 | HR 61 | Ht 60.5 in | Wt 177.0 lb

## 2023-04-04 DIAGNOSIS — I25118 Atherosclerotic heart disease of native coronary artery with other forms of angina pectoris: Secondary | ICD-10-CM | POA: Diagnosis not present

## 2023-04-04 DIAGNOSIS — I10 Essential (primary) hypertension: Secondary | ICD-10-CM

## 2023-04-04 MED ORDER — ISOSORBIDE MONONITRATE ER 60 MG PO TB24: 60.0000 mg | ORAL_TABLET | Freq: Every day | ORAL | 3 refills | Status: DC

## 2023-04-04 NOTE — Progress Notes (Signed)
  Cardiology Office Note:   Date:  04/04/2023  ID:  Alyssa Burns, DOB February 09, 1951, MRN [id number] PCP: Renato Dorothey HERO, NP  [facility] Providers Cardiologist:  Lynwood Schilling, MD {  History of Present Illness:   Alyssa Burns is a 73 y.o. female who presents for follow up of CAD.  She had a DES to the LAD in 2020.   Since I last saw her she said she has been slowly developing exertional dyspnea over the past year.  It has been getting progressively worse to the point where she walks maybe less than 15 minutes and starts to get short of breath.  She gets a little episode of things going slightly black.  It is getting more intense and frequent although the darkening tunnel vision is not consistent.  She is not getting associated nausea vomiting or diaphoresis.  She is not describing chest pressure.  However, she never really had symptoms to begin with.  She is not having any new palpitations, presyncope or syncope.  ROS: As stated in the HPI and negative for all other systems.  Studies Reviewed:    EKG:   EKG Interpretation Date/Time:  Tuesday April 04 2023 13:42:41 EST Ventricular Rate:  61 PR Interval:  168 QRS Duration:  94 QT Interval:  416 QTC Calculation: 418 R Axis:   -10  Text Interpretation: Normal sinus rhythm When compared with ECG of 15-Sep-2018 05:46, No significant change was found Confirmed by Schilling Lynwood ([id number]) on 04/04/2023 1:48:34 PM     Risk Assessment/Calculations:     Physical Exam:   VS:  BP (!) 162/90   Pulse 61   Ht 5' 0.5 (1.537 m)   Wt 177 lb (80.3 kg)   BMI 34.00 kg/m    Wt Readings from Last 3 Encounters:  04/04/23 177 lb (80.3 kg)  11/09/21 169 lb 12.1 oz (77 kg)  10/28/21 171 lb (77.6 kg)     GEN: Well nourished, well developed in no acute distress NECK: No JVD; No carotid bruits CARDIAC: RRR, no murmurs, rubs, gallops RESPIRATORY:  Clear to auscultation without rales, wheezing or rhonchi  ABDOMEN: Soft,  non-tender, non-distended EXTREMITIES:  No edema; No deformity   ASSESSMENT AND PLAN:   Coronary artery disease:    The patient has some exertional dyspnea which could be an anginal equivalent.  She would prefer more conservative management and I am going to start with Imdur  60 mg daily.  If however her symptoms progress or this does not take care of the exertional dyspnea I would consider further testing.  I did review the 2020 films and I might lean towards cardiac cath if her symptoms progress.  She agrees to let me know if that is the case.   Hypertension:   The BP was elevated today but she says it is always in the systolics 130s.  She will keep an eye on that at home.  Obstructive sleep apnea: She has been treated with CPAP.   Hyperlipidemia:     She has not wanted to take a statin or PCSK9 inhibitor.  We talked about this at length.  We talked about a plant forward diet.     Follow up with me in six months or sooner if needed  Signed, Lynwood Schilling, MD

## 2023-04-04 NOTE — Patient Instructions (Signed)
 Medication Instructions:  Please start Isosorbide  60 mg daily. Continue all other medications as listed.  *If you need a refill on your cardiac medications before your next appointment, please call your pharmacy*  Follow-Up: At [facility], you and your health needs are our priority.  As part of our continuing mission to provide you with exceptional heart care, we have created designated Provider Care Teams.  These Care Teams include your primary Cardiologist (physician) and Advanced Practice Providers (APPs -  Physician Assistants and Nurse Practitioners) who all work together to provide you with the care you need, when you need it.  We recommend signing up for the patient portal called MyChart.  Sign up information is provided on this After Visit Summary.  MyChart is used to connect with patients for Virtual Visits (Telemedicine).  Patients are able to view lab/test results, encounter notes, upcoming appointments, etc.  Non-urgent messages can be sent to your provider as well.   To learn more about what you can do with MyChart, go to [email or web address].    Your next appointment:   6 month(s)  Provider:   Lynwood Schilling, MD

## 2023-12-03 NOTE — Progress Notes (Unsigned)
  Cardiology Office Note:   Date:  12/03/2023  ID:  Alyssa Burns, DOB Nov 03, 1950, MRN [id number] PCP: Renato Dorothey HERO, NP  [facility] Providers Cardiologist:  Lynwood Schilling, MD {  History of Present Illness:   Alyssa Burns is a 73 y.o. female who presents for follow up of CAD. She had a DES to the LAD in 2020. Since I last saw her she said she has been slowly developing exertional dyspnea over the past year. It has been getting progressively worse to the point where she walks maybe less than 15 minutes and starts to get short of breath. She gets a little episode of things going slightly black. It is getting more intense and frequent although the darkening tunnel vision is not consistent. She is not getting associated nausea vomiting or diaphoresis. She is not describing chest pressure. However, she never really had symptoms to begin with. She is not having any new palpitations, presyncope or syncope.   ROS: ***  Studies Reviewed:    EKG:       ***  Risk Assessment/Calculations:   {Does this patient have ATRIAL FIBRILLATION?:[phone]} No BP recorded.  {Refresh Note OR Click here to enter BP  :1}***        Physical Exam:   VS:  There were no vitals taken for this visit.   Wt Readings from Last 3 Encounters:  04/04/23 177 lb (80.3 kg)  11/09/21 169 lb 12.1 oz (77 kg)  10/28/21 171 lb (77.6 kg)     GEN: Well nourished, well developed in no acute distress NECK: No JVD; No carotid bruits CARDIAC: ***RR, *** murmurs, rubs, gallops RESPIRATORY:  Clear to auscultation without rales, wheezing or rhonchi  ABDOMEN: Soft, non-tender, non-distended EXTREMITIES:  No edema; No deformity   ASSESSMENT AND PLAN:   Coronary artery disease:   ***  The patient has some exertional dyspnea which could be an anginal equivalent.  She would prefer more conservative management and I am going to start with Imdur  60 mg daily.  If however her symptoms progress or this does not take  care of the exertional dyspnea I would consider further testing.  I did review the 2020 films and I might lean towards cardiac cath if her symptoms progress.  She agrees to let me know if that is the case.   Hypertension:   The BP *** was elevated today but she says it is always in the systolics 130s.  She will keep an eye on that at home.   Obstructive sleep apnea: ***  She has been treated with CPAP.   Hyperlipidemia:   **    She has not wanted to take a statin or PCSK9 inhibitor.  We talked about this at length.  We talked about a plant forward diet.     Follow up ***  Signed, Lynwood Schilling, MD

## 2023-12-06 ENCOUNTER — Encounter: Payer: Self-pay | Admitting: Cardiology

## 2023-12-06 ENCOUNTER — Ambulatory Visit: Admitting: Cardiology

## 2023-12-06 VITALS — BP 144/90 | HR 72 | Ht 61.0 in | Wt 177.0 lb

## 2023-12-06 DIAGNOSIS — E785 Hyperlipidemia, unspecified: Secondary | ICD-10-CM

## 2023-12-06 DIAGNOSIS — I25118 Atherosclerotic heart disease of native coronary artery with other forms of angina pectoris: Secondary | ICD-10-CM | POA: Diagnosis not present

## 2023-12-06 DIAGNOSIS — I10 Essential (primary) hypertension: Secondary | ICD-10-CM | POA: Diagnosis not present

## 2023-12-06 NOTE — Patient Instructions (Signed)
